# Patient Record
Sex: Female | Born: 1985
Health system: Southern US, Community
[De-identification: ages and names within clinical notes are randomized; demographics above are authoritative.]

## PROBLEM LIST (undated history)

## (undated) DIAGNOSIS — S82409A Unspecified fracture of shaft of unspecified fibula, initial encounter for closed fracture: Secondary | ICD-10-CM

## (undated) DIAGNOSIS — R112 Nausea with vomiting, unspecified: Secondary | ICD-10-CM

## (undated) DIAGNOSIS — Z9889 Other specified postprocedural states: Secondary | ICD-10-CM

## (undated) DIAGNOSIS — E119 Type 2 diabetes mellitus without complications: Secondary | ICD-10-CM

## (undated) DIAGNOSIS — S82209A Unspecified fracture of shaft of unspecified tibia, initial encounter for closed fracture: Secondary | ICD-10-CM

## (undated) DIAGNOSIS — R519 Headache, unspecified: Secondary | ICD-10-CM

## (undated) DIAGNOSIS — E669 Obesity, unspecified: Secondary | ICD-10-CM

## (undated) DIAGNOSIS — J4540 Moderate persistent asthma, uncomplicated: Secondary | ICD-10-CM

## (undated) HISTORY — DX: Other specified postprocedural states: R11.2

## (undated) HISTORY — DX: Unspecified fracture of shaft of unspecified tibia, initial encounter for closed fracture: S82.409A

## (undated) HISTORY — DX: Obesity, unspecified: E66.9

## (undated) HISTORY — DX: Moderate persistent asthma, uncomplicated: J45.40

## (undated) HISTORY — DX: Unspecified fracture of shaft of unspecified tibia, initial encounter for closed fracture: S82.209A

## (undated) HISTORY — PX: IM NAILING TIBIA: SUR734

## (undated) HISTORY — DX: Type 2 diabetes mellitus without complications: E11.9

---
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
## 2010-11-30 ENCOUNTER — Encounter: Payer: Self-pay | Admitting: *Deleted

## 2010-11-30 ENCOUNTER — Emergency Department (HOSPITAL_BASED_OUTPATIENT_CLINIC_OR_DEPARTMENT_OTHER)
Admission: EM | Admit: 2010-11-30 | Discharge: 2010-11-30 | Disposition: A | Discharge status: Home / Self Care | Payer: Self-pay | Attending: Emergency Medicine | Admitting: Emergency Medicine

## 2010-11-30 DIAGNOSIS — S39012A Strain of muscle, fascia and tendon of lower back, initial encounter: Secondary | ICD-10-CM

## 2010-11-30 DIAGNOSIS — N390 Urinary tract infection, site not specified: Secondary | ICD-10-CM | POA: Insufficient documentation

## 2010-11-30 DIAGNOSIS — S335XXA Sprain of ligaments of lumbar spine, initial encounter: Secondary | ICD-10-CM | POA: Insufficient documentation

## 2010-11-30 DIAGNOSIS — X500XXA Overexertion from strenuous movement or load, initial encounter: Secondary | ICD-10-CM | POA: Insufficient documentation

## 2010-11-30 LAB — URINALYSIS, ROUTINE W REFLEX MICROSCOPIC
Glucose, UA: NEGATIVE mg/dL
Hgb urine dipstick: NEGATIVE
Leukocytes, UA: NEGATIVE
pH: 5.5 (ref 5.0–8.0)

## 2010-11-30 LAB — PREGNANCY, URINE: Preg Test, Ur: NEGATIVE

## 2010-11-30 MED ORDER — DIAZEPAM 5 MG PO TABS: 5.0000 mg | ORAL_TABLET | Freq: Two times a day (BID) | ORAL | Status: AC

## 2010-11-30 MED ORDER — PHENAZOPYRIDINE HCL 200 MG PO TABS: 200.0000 mg | ORAL_TABLET | Freq: Three times a day (TID) | ORAL | Status: AC

## 2010-11-30 MED ORDER — IBUPROFEN 800 MG PO TABS: 800.0000 mg | ORAL_TABLET | Freq: Three times a day (TID) | ORAL | Status: AC

## 2010-11-30 NOTE — ED Provider Notes (Signed)
History     CSN: [id number] Arrival date & time: 11/30/2010  7:53 PM  Chief Complaint  Patient presents with  . Back Pain  . Urinary Tract Infection   HPI Pt has had sharp, intermittent pain in right low back for the past 3 days.  Non-radiating.  Aggravated by lifting.  Associated w/ dysuria, hematuria, decreased frequency of urination and incomplete evacuation.  Denies fever, LE weakness/paresthesias.  Denies trauma.  Had similar symptoms one month ago, was evaluated at another ER, had blood work and CT abd/pelvis and told that she may have passed a kidney stone.    History reviewed. No pertinent past medical history.  History reviewed. No pertinent past surgical history.  No family history on file.  History  Substance Use Topics  . Smoking status: Never Smoker   . Smokeless tobacco: Not on file  . Alcohol Use: No    OB History    Grav Para Term Preterm Abortions TAB SAB Ect Mult Living                  Review of Systems  All other systems reviewed and are negative.    Physical Exam  BP 137/93  Pulse 84  Temp(Src) 98 F (36.7 C) (Oral)  Resp 16  SpO2 100%  LMP 11/16/2010  Physical Exam  Nursing note and vitals reviewed. Constitutional: She is oriented to person, place, and time. She appears well-developed and well-nourished.  HENT:  Head: Normocephalic and atraumatic.  Eyes:       Normal appearance  Neck: Normal range of motion.  Cardiovascular: Normal rate and regular rhythm.   Pulmonary/Chest: Effort normal and breath sounds normal.  Abdominal: Soft. She exhibits no distension. There is no tenderness.  Musculoskeletal:       No spinal tenderness.  Lumbar paraspinal and right lower back ttp. No CVA ttp. Full ROM of LE.  Nml patellar reflexes.  No saddle anesthesia. Distal sensation intact.  2+ DP pulses.  Ambulates w/out diffulty.   Neurological: She is alert and oriented to person, place, and time.  Skin: Skin is warm and dry. No rash noted.    Psychiatric: She has a normal mood and affect. Her behavior is normal.    ED Course  Procedures  MDM Healthy 25yo F presents w/ non-traumatic low back pain.  Evaluated for same recently at another ED and told that she possibly passed a kidney stone.  Doubt kidney stone because no definite prior history, pain intermittent/brief, aggravated by movement and located inferior to right CVA and no hemoglobinuria.  U/A neg for infection.  No back pain red flags.  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  Recommendations for conservative therapy as well as strict return precautions discussed.  Discharged home with pain medication as well as pyridium for dysuira.       Otilio Miu, PA 12/01/10 0010

## 2010-11-30 NOTE — ED Notes (Signed)
Pt presents with low back pain and UTI sx.  Pt states hx of kidney stone about 2 mos ago.  Pt has had no sx since.  Pt took ibuprofen aroun 10p last night

## 2010-12-09 NOTE — ED Provider Notes (Signed)
Medical screening examination/treatment/procedure(s) were performed by non-physician practitioner and as supervising physician I was immediately available for consultation/collaboration.  Toy Baker, MD 12/09/10 [phone]

## 2012-04-19 ENCOUNTER — Emergency Department (HOSPITAL_BASED_OUTPATIENT_CLINIC_OR_DEPARTMENT_OTHER): Payer: Self-pay

## 2012-04-19 ENCOUNTER — Emergency Department (HOSPITAL_BASED_OUTPATIENT_CLINIC_OR_DEPARTMENT_OTHER)
Admission: EM | Admit: 2012-04-19 | Discharge: 2012-04-19 | Disposition: A | Discharge status: Home / Self Care | Payer: Self-pay | Attending: Emergency Medicine | Admitting: Emergency Medicine

## 2012-04-19 ENCOUNTER — Encounter (HOSPITAL_BASED_OUTPATIENT_CLINIC_OR_DEPARTMENT_OTHER): Payer: Self-pay

## 2012-04-19 DIAGNOSIS — N949 Unspecified condition associated with female genital organs and menstrual cycle: Secondary | ICD-10-CM | POA: Insufficient documentation

## 2012-04-19 DIAGNOSIS — N898 Other specified noninflammatory disorders of vagina: Secondary | ICD-10-CM | POA: Insufficient documentation

## 2012-04-19 DIAGNOSIS — R1031 Right lower quadrant pain: Secondary | ICD-10-CM | POA: Insufficient documentation

## 2012-04-19 DIAGNOSIS — R102 Pelvic and perineal pain: Secondary | ICD-10-CM

## 2012-04-19 DIAGNOSIS — R110 Nausea: Secondary | ICD-10-CM | POA: Insufficient documentation

## 2012-04-19 DIAGNOSIS — Z3202 Encounter for pregnancy test, result negative: Secondary | ICD-10-CM | POA: Insufficient documentation

## 2012-04-19 LAB — URINALYSIS, ROUTINE W REFLEX MICROSCOPIC
Leukocytes, UA: NEGATIVE
Nitrite: NEGATIVE
Specific Gravity, Urine: 1.018 (ref 1.005–1.030)
Urobilinogen, UA: 0.2 mg/dL (ref 0.0–1.0)
pH: 6 (ref 5.0–8.0)

## 2012-04-19 LAB — PREGNANCY, URINE: Preg Test, Ur: NEGATIVE

## 2012-04-19 LAB — URINE MICROSCOPIC-ADD ON

## 2012-04-19 LAB — WET PREP, GENITAL

## 2012-04-19 MED ORDER — MORPHINE SULFATE 4 MG/ML IJ SOLN
4.0000 mg | Freq: Once | INTRAMUSCULAR | Status: AC
  Administered 2012-04-19: 4 mg via INTRAVENOUS
  Filled 2012-04-19: qty 1

## 2012-04-19 MED ORDER — ONDANSETRON HCL 4 MG/2ML IJ SOLN
4.0000 mg | Freq: Once | INTRAMUSCULAR | Status: AC
  Administered 2012-04-19: 4 mg via INTRAVENOUS
  Filled 2012-04-19: qty 2

## 2012-04-19 MED ORDER — OXYCODONE-ACETAMINOPHEN 5-325 MG PO TABS: 1.0000 | ORAL_TABLET | ORAL | Status: DC | PRN

## 2012-04-19 NOTE — ED Notes (Signed)
C/o back pain and abd cramping started x 5 days ago

## 2012-04-19 NOTE — ED Notes (Signed)
Return from xray

## 2012-04-19 NOTE — ED Notes (Signed)
EDP Belfi notified pt reports blood when wipes after voiding-EDP ordered CCUA for UA

## 2012-04-19 NOTE — ED Notes (Signed)
MD at bedside. 

## 2012-04-19 NOTE — ED Provider Notes (Addendum)
History     CSN: [id number]  Arrival date & time 04/19/12  1202   First MD Initiated Contact with Patient 04/19/12 1216      Chief Complaint  Patient presents with  . Back Pain    (Consider location/radiation/quality/duration/timing/severity/associated sxs/prior treatment) HPI Comments: Patient presents with a four-day history of right flank pain. She states it started as cramping in her right lower abdomen and now it's progressed to her right mid back. It's not related to movement. Her pain now is mostly in her back she only has minimal pain in her right lower abdomen. She also has noted some vaginal bleeding and blood which she feels it's in her urine when she wipes. She denies any vaginal discharge. She's had some nausea but no vomiting. She denies any fevers or chills. She denies any known injury to her back. About one year ago she states she was diagnosed with a kidney stone but this was not actually seen on CT scan it was inferred given flank pain associated with hematuria. She's been taking ibuprofen without relief.   History reviewed. No pertinent past medical history.  Past Surgical History  Procedure Date  . Leg surgery     No family history on file.  History  Substance Use Topics  . Smoking status: Never Smoker   . Smokeless tobacco: Not on file  . Alcohol Use: No    OB History    Grav Para Term Preterm Abortions TAB SAB Ect Mult Living                  Review of Systems  Constitutional: Negative for fever, chills, diaphoresis and fatigue.  HENT: Negative for congestion, rhinorrhea and sneezing.   Eyes: Negative.   Respiratory: Negative for cough, chest tightness and shortness of breath.   Cardiovascular: Negative for chest pain and leg swelling.  Gastrointestinal: Positive for nausea and abdominal pain. Negative for vomiting, diarrhea and blood in stool.  Genitourinary: Positive for vaginal bleeding. Negative for frequency, hematuria, flank pain, vaginal  discharge and difficulty urinating.  Musculoskeletal: Positive for back pain. Negative for arthralgias.  Skin: Negative for rash.  Neurological: Negative for dizziness, speech difficulty, weakness, numbness and headaches.    Allergies  Ibuprofen  Home Medications   Current Outpatient Rx  Name  Route  Sig  Dispense  Refill  . IBUPROFEN 200 MG PO TABS   Oral   Take 200 mg by mouth 2 (two) times daily as needed. For pain             BP 121/62  Pulse 82  Temp 97.8 F (36.6 C) (Oral)  Resp 20  Ht 5\' 3"  (1.6 m)  Wt 225 lb (102.059 kg)  BMI 39.86 kg/m2  SpO2 100%  LMP 04/15/2012  Physical Exam  Constitutional: She is oriented to person, place, and time. She appears well-developed and well-nourished.  HENT:  Head: Normocephalic and atraumatic.  Eyes: Pupils are equal, round, and reactive to light.  Neck: Normal range of motion. Neck supple.  Cardiovascular: Normal rate, regular rhythm and normal heart sounds.   Pulmonary/Chest: Effort normal and breath sounds normal. No respiratory distress. She has no wheezes. She has no rales. She exhibits no tenderness.  Abdominal: Soft. Bowel sounds are normal. There is no tenderness (mild TTP right lower abd). There is no rebound and no guarding.       +right CVA tenderness  Genitourinary:       Hymen appears intact.  +tenderness over uterine  and adnexal area.  Some dark blood in vault.  No discharge.  Musculoskeletal: Normal range of motion. She exhibits no edema.  Lymphadenopathy:    She has no cervical adenopathy.  Neurological: She is alert and oriented to person, place, and time.  Skin: Skin is warm and dry. No rash noted.  Psychiatric: She has a normal mood and affect.    ED Course  Procedures (including critical care time)  Results for orders placed during the hospital encounter of 04/19/12  URINALYSIS, ROUTINE W REFLEX MICROSCOPIC      Component Value Range   Color, Urine YELLOW  YELLOW   APPearance CLOUDY (*) CLEAR    Specific Gravity, Urine 1.018  1.005 - 1.030   pH 6.0  5.0 - 8.0   Glucose, UA NEGATIVE  NEGATIVE mg/dL   Hgb urine dipstick LARGE (*) NEGATIVE   Bilirubin Urine NEGATIVE  NEGATIVE   Ketones, ur NEGATIVE  NEGATIVE mg/dL   Protein, ur NEGATIVE  NEGATIVE mg/dL   Urobilinogen, UA 0.2  0.0 - 1.0 mg/dL   Nitrite NEGATIVE  NEGATIVE   Leukocytes, UA NEGATIVE  NEGATIVE  PREGNANCY, URINE      Component Value Range   Preg Test, Ur NEGATIVE  NEGATIVE  URINE MICROSCOPIC-ADD ON      Component Value Range   Squamous Epithelial / LPF FEW (*) RARE   WBC, UA 0-2  <3 WBC/hpf   RBC / HPF 3-6  <3 RBC/hpf   Bacteria, UA MANY (*) RARE  WET PREP, GENITAL      Component Value Range   Yeast Wet Prep HPF POC NONE SEEN  NONE SEEN   Trich, Wet Prep NONE SEEN  NONE SEEN   Clue Cells Wet Prep HPF POC FEW (*) NONE SEEN   WBC, Wet Prep HPF POC FEW (*) NONE SEEN   Ct Abdomen Pelvis Wo Contrast  04/19/2012  *RADIOLOGY REPORT*  Clinical Data: 5-day history of right flank pain.  Prior history of urinary tract calculi.  CT ABDOMEN AND PELVIS WITHOUT CONTRAST  Technique:  Multidetector CT imaging of the abdomen and pelvis was performed following the standard protocol without intravenous contrast.  Comparison: None.  Findings: No evidence of urinary tract calculi or obstruction on either side.  Within the limits of the unenhanced technique, no focal parenchymal abnormality involving either kidney.  Sub-centimeter cyst in the anterior segment right lobe of liver near the dome; within the limits of the unenhanced technique, no significant focal hepatic parenchymal abnormalities.  Normal unenhanced appearance of the spleen, pancreas, adrenal glands, and gallbladder.  No biliary ductal dilation.  No visible aorto- iliofemoral atherosclerosis.  No significant lymphadenopathy.  Normal-appearing stomach and small bowel.  Large stool burden throughout normal appearing colon.  Small appendicolith in the distal appendix which is  positioned in the mid and upper pelvis; no evidence of acute appendicitis.  No ascites.  Urinary bladder unremarkable.  Normal-appearing uterus and ovaries for age.  No free pelvic fluid.  Phlebolith low in the right side of the pelvis.  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  Visualized lung bases clear.  Heart size normal.  IMPRESSION:  1.  No evidence of urinary tract calculi or obstruction. 2.  No acute abnormalities involving the abdomen or pelvis.  Large stool burden. 3.  Appendicolith in the distal appendix without evidence of acute appendicitis.   Original Report Authenticated By: Hulan Saas, M.D.  1. Pelvic pain       MDM  PT with right pelvic pain/right lower back pain.  No vaginal discharge suggestive of infection.  No evidence of kidney stone.  Hematuria is likely from contamination from vaginal bleeding.  Pelvic u/s pending.  Dr Preston Fleeting to follow up on results.  Anticipate if negative, pt to be discharge home with analgesics.        Rolan Bucco, MD 04/19/12 1507  Ultrasound report has come back and shows no evidence of ovarian cyst or torsion. She will be treated symptomatically and is sent home with prescription for Percocet.   US Pelvis Complete  04/19/2012  *RADIOLOGY REPORT*  Clinical Data:  Right flank pain and right-sided pelvic pain that began on 04/15/2012, the date of her LMP.  TRANSABDOMINAL AND TRANSVAGINAL ULTRASOUND OF PELVIS DOPPLER ULTRASOUND OF OVARIES  Technique:  Both transabdominal and transvaginal ultrasound examinations of the pelvis were performed. Transabdominal technique was performed for global imaging of the pelvis including uterus, ovaries, adnexal regions, and pelvic cul-de-sac.  It was necessary to proceed with endovaginal exam following the transabdominal exam to visualize the endometrium and ovaries, as the bladder was  incompletely distended.  Color and duplex Doppler ultrasound was utilized to evaluate blood flow to the ovaries.  Comparison:  Unenhanced CT abdomen pelvis performed earlier same date.  No prior ultrasound.  Findings:  Uterus:  Normal in size and appearance without focal myometrial abnormality, measuring approximate 6.2 x 3.4 x 4.7 cm.  Endometrium:  Normal in appearance measuring 1-3 mm in thickness. No endometrial fluid or mass.  Right ovary: Normal in size and appearance, containing multiple small follicular cysts, measuring approximately 4.0 x 2.8 x 2.1 cm. Normal color Doppler flow within the ovary.  No dominant cyst or solid mass.  Left ovary:   Normal in size and appearance, containing multiple small follicular cysts, measuring approximately 3.9 x 2.0 x 3.0 cm. Normal color Doppler flow within the ovary.  No dominant cyst or solid mass.  Pulsed Doppler evaluation demonstrates normal low-resistance arterial and venous waveforms in both ovaries.  Other findings:  Minimal, likely physiologic free fluid in the cul- de-sac.  IMPRESSION: Normal exam.  No evidence of pelvic mass or other significant abnormality.  No sonographic evidence for ovarian torsion.   Original Report Authenticated By: Hulan Saas, M.D.       Dione Booze, MD 04/19/12 [id number]

## 2012-04-19 NOTE — ED Notes (Signed)
MD at bedside. EDP Preston Fleeting at [facility]

## 2012-04-19 NOTE — ED Notes (Signed)
Spoke to pt in depth about need for GYN f/u due to her c/o of menstrual pds x2 less than 28 days as she was also advised by EDP Belfi-pt was advised to f/u with her local [facility] (she lives in [place]) also advised she could be seen at [facility] or [facility]

## 2012-04-19 NOTE — ED Notes (Signed)
Pt to xray for US 

## 2012-04-21 LAB — GC/CHLAMYDIA PROBE AMP
CT Probe RNA: NEGATIVE
GC Probe RNA: NEGATIVE

## 2012-06-26 DIAGNOSIS — E559 Vitamin D deficiency, unspecified: Secondary | ICD-10-CM | POA: Insufficient documentation

## 2012-09-09 DIAGNOSIS — J4550 Severe persistent asthma, uncomplicated: Secondary | ICD-10-CM | POA: Insufficient documentation

## 2014-10-28 ENCOUNTER — Encounter: Payer: Self-pay | Admitting: Sports Medicine

## 2014-10-28 ENCOUNTER — Ambulatory Visit (INDEPENDENT_AMBULATORY_CARE_PROVIDER_SITE_OTHER): Payer: BC Managed Care – PPO | Admitting: Sports Medicine

## 2014-10-28 VITALS — BP 149/102 | HR 96 | Ht 63.0 in | Wt 275.0 lb

## 2014-10-28 DIAGNOSIS — E669 Obesity, unspecified: Secondary | ICD-10-CM

## 2014-10-28 DIAGNOSIS — M722 Plantar fascial fibromatosis: Secondary | ICD-10-CM | POA: Insufficient documentation

## 2014-10-28 DIAGNOSIS — J4540 Moderate persistent asthma, uncomplicated: Secondary | ICD-10-CM | POA: Diagnosis not present

## 2014-10-28 DIAGNOSIS — Z0000 Encounter for general adult medical examination without abnormal findings: Secondary | ICD-10-CM | POA: Insufficient documentation

## 2014-10-28 HISTORY — DX: Obesity, unspecified: E66.9

## 2014-10-28 HISTORY — DX: Moderate persistent asthma, uncomplicated: J45.40

## 2014-10-28 LAB — CBC
HCT: 45.5 % (ref 36.0–46.0)
Hemoglobin: 15.4 g/dL — ABNORMAL HIGH (ref 12.0–15.0)
MCH: 27.5 pg (ref 26.0–34.0)
MCHC: 33.8 g/dL (ref 30.0–36.0)
MCV: 81.4 fL (ref 78.0–100.0)
MPV: 11 fL (ref 8.6–12.4)
Platelets: 249 K/uL (ref 150–400)
RBC: 5.59 MIL/uL — ABNORMAL HIGH (ref 3.87–5.11)
RDW: 13.5 % (ref 11.5–15.5)
WBC: 4.9 K/uL (ref 4.0–10.5)

## 2014-10-28 LAB — TSH: TSH: 2.557 u[IU]/mL (ref 0.350–4.500)

## 2014-10-28 LAB — COMPREHENSIVE METABOLIC PANEL
Albumin: 4.3 g/dL (ref 3.5–5.2)
BUN: 14 mg/dL (ref 6–23)
CO2: 24 mEq/L (ref 19–32)
Chloride: 107 mEq/L (ref 96–112)
Glucose, Bld: 92 mg/dL (ref 70–99)
Total Bilirubin: 0.6 mg/dL (ref 0.2–1.2)

## 2014-10-28 LAB — COMPREHENSIVE METABOLIC PANEL WITH GFR
ALT: 30 U/L (ref 0–35)
AST: 23 U/L (ref 0–37)
Alkaline Phosphatase: 69 U/L (ref 39–117)
Calcium: 9.5 mg/dL (ref 8.4–10.5)
Creat: 0.73 mg/dL (ref 0.50–1.10)
Potassium: 4.1 meq/L (ref 3.5–5.3)
Sodium: 142 meq/L (ref 135–145)
Total Protein: 7.2 g/dL (ref 6.0–8.3)

## 2014-10-28 LAB — LIPID PANEL
Cholesterol: 178 mg/dL (ref 0–200)
HDL: 45 mg/dL — ABNORMAL LOW (ref >=46)
LDL Cholesterol: 111 mg/dL — ABNORMAL HIGH (ref 0–99)
Total CHOL/HDL Ratio: 4 Ratio
Triglycerides: 108 mg/dL (ref <150)
VLDL: 22 mg/dL (ref 0–40)

## 2014-10-28 LAB — HEMOGLOBIN A1C
Hgb A1c MFr Bld: 5.9 % — ABNORMAL HIGH (ref <5.7)
Mean Plasma Glucose: 123 mg/dL — ABNORMAL HIGH (ref <117)

## 2014-10-28 MED ORDER — PHENTERMINE HCL 37.5 MG PO TABS: ORAL_TABLET | ORAL | Status: DC

## 2014-10-28 MED ORDER — LIRAGLUTIDE -WEIGHT MANAGEMENT 18 MG/3ML ~~LOC~~ SOPN: 3.0000 mg | PEN_INJECTOR | Freq: Every day | SUBCUTANEOUS | Status: DC

## 2014-10-28 MED ORDER — BUDESONIDE-FORMOTEROL FUMARATE 160-4.5 MCG/ACT IN AERO: 1.0000 | INHALATION_SPRAY | Freq: Two times a day (BID) | RESPIRATORY_TRACT | Status: DC

## 2014-10-28 NOTE — Progress Notes (Signed)
  Subjective:    CC: Establish care.   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  Obesity: Desires help with weight loss, has tried dieting and exercise without much improvement.  Heel pain: Left-sided, worse in the morning, localized on the plantar aspect of the calcaneal insertion of the plantar fascia, moderate, persistent without radiation, present for several months.  Moderate persistent asthma: Currently using Symbicort, Nasonex, Singulair, overall she does well but her Symbicort is very expensive.  Preventive measures: Up-to-date on cervical cancer screening.  Past medical history, Surgical history, Family history not pertinant except as noted below, Social history, Allergies, and medications have been entered into the medical record, reviewed, and no changes needed.   Review of Systems: No headache, visual changes, nausea, vomiting, diarrhea, constipation, dizziness, abdominal pain, skin rash, fevers, chills, night sweats, swollen lymph nodes, weight loss, chest pain, body aches, joint swelling, muscle aches, shortness of breath, mood changes, visual or auditory hallucinations.  Objective:    General: Well Developed, well nourished, and in no acute distress.  Neuro: Alert and oriented x3, extra-ocular muscles intact, sensation grossly intact.  HEENT: Normocephalic, atraumatic, pupils equal round reactive to light, neck supple, no masses, no lymphadenopathy, thyroid nonpalpable.  Skin: Warm and dry, no rashes noted.  Cardiac: Regular rate and rhythm, no murmurs rubs or gallops.  Respiratory: Clear to auscultation bilaterally. Not using accessory muscles, speaking in full sentences.  Abdominal: Soft, nontender, nondistended, positive bowel sounds, no masses, no organomegaly.  Left Foot: No visible erythema or swelling. Range of motion is full in all directions. Strength is 5/5 in all directions. No hallux valgus. No pes cavus or pes  planus. No abnormal callus noted. No pain over the navicular prominence, or base of fifth metatarsal. Tender to palpation of the calcaneal insertion of plantar fascia. No pain at the Achilles insertion. No pain over the calcaneal bursa. No pain of the retrocalcaneal bursa. No tenderness to palpation over the tarsals, metatarsals, or phalanges. No hallux rigidus or limitus. No tenderness palpation over interphalangeal joints. No pain with compression of the metatarsal heads. Neurovascularly intact distally.  Impression and Recommendations:    The patient was counselled, risk factors were discussed, anticipatory guidance given.

## 2014-10-28 NOTE — Assessment & Plan Note (Signed)
Up to date on cervical cancer screening

## 2014-10-28 NOTE — Assessment & Plan Note (Signed)
Phentermine, Saxenda, checking blood work. Return for nurse visit to learn how to do injections and then return in one month for a weight check

## 2014-10-28 NOTE — Assessment & Plan Note (Signed)
Under good control, refilling Symbicort with a discount coupon

## 2014-10-28 NOTE — Assessment & Plan Note (Signed)
Rehabilitation exercises, return for custom orthotics.

## 2014-10-29 ENCOUNTER — Telehealth: Payer: Self-pay | Admitting: Sports Medicine

## 2014-10-29 LAB — VITAMIN D 25 HYDROXY (VIT D DEFICIENCY, FRACTURES): Vit D, 25-Hydroxy: 14 ng/mL — ABNORMAL LOW (ref 30–100)

## 2014-10-29 MED ORDER — VITAMIN D (ERGOCALCIFEROL) 1.25 MG (50000 UNIT) PO CAPS
50000.0000 [IU] | ORAL_CAPSULE | ORAL | Status: DC
Start: 2014-10-29 — End: 2015-04-14

## 2014-10-29 NOTE — Telephone Encounter (Signed)
Received fax from pharmacy for phentermine 37.5 mg tablets sent through cover my meds and it is authorized from 09/29/2014 - 01/27/2015 case id [id number]. - CF

## 2014-10-29 NOTE — Addendum Note (Signed)
Addended by: Monica BectonHEKKEKANDAM, THOMAS J on: 10/29/2014 12:20 PM   Modules accepted: Orders

## 2014-11-01 ENCOUNTER — Telehealth: Payer: Self-pay

## 2014-11-01 DIAGNOSIS — E669 Obesity, unspecified: Secondary | ICD-10-CM

## 2014-11-01 MED ORDER — LIRAGLUTIDE 18 MG/3ML ~~LOC~~ SOPN: PEN_INJECTOR | SUBCUTANEOUS | Status: DC

## 2014-11-01 NOTE — Telephone Encounter (Signed)
Patient called stated that she can not afford the Saxenda even with the discount coupon it is $1000. Please advise patient what the next option is. Rhonda Cunningham,CMA

## 2014-11-01 NOTE — Telephone Encounter (Signed)
Switch to victoza, Rx here, print out coupon online.

## 2014-11-02 NOTE — Telephone Encounter (Signed)
Spoke to patient advised her that Rx Victoza could be picked up from the office and to also print discount coupon online. Azrael Huss,CMA

## 2014-11-03 ENCOUNTER — Ambulatory Visit (INDEPENDENT_AMBULATORY_CARE_PROVIDER_SITE_OTHER): Payer: BC Managed Care – PPO | Admitting: Sports Medicine

## 2014-11-03 VITALS — BP 124/84 | HR 102 | Wt 270.0 lb

## 2014-11-03 DIAGNOSIS — E669 Obesity, unspecified: Secondary | ICD-10-CM

## 2014-11-03 NOTE — Progress Notes (Signed)
Patient came into clinic today for education on administration of Victoza. Pt brought the injection into office today with her, and had her own needles. Went into great detail on administration locations, how to apply/remove needles, how to dial up dose (0.6mg, increased weekly by 0.6mg until max dose of 1.8mg reached), administration of injection, and proper disposal of used needles. Stressed the importance of needle safety and went over risk of infection possibilities or contamination that could occur if needles aren't changed after every injection. Pt states she would change the needle each time. Pt was able to administer the injection in office today, no complications. No further questions. Advised Pt to contact office if she has any further questions or concerns, verbalized understanding.  

## 2014-11-03 NOTE — Assessment & Plan Note (Signed)
Injection education as above.

## 2014-11-25 ENCOUNTER — Ambulatory Visit (INDEPENDENT_AMBULATORY_CARE_PROVIDER_SITE_OTHER): Payer: BC Managed Care – PPO | Admitting: Sports Medicine

## 2014-11-25 ENCOUNTER — Encounter: Payer: Self-pay | Admitting: Sports Medicine

## 2014-11-25 VITALS — BP 129/82 | HR 98 | Ht 63.0 in | Wt 259.0 lb

## 2014-11-25 DIAGNOSIS — M722 Plantar fascial fibromatosis: Secondary | ICD-10-CM | POA: Diagnosis not present

## 2014-11-25 DIAGNOSIS — E669 Obesity, unspecified: Secondary | ICD-10-CM | POA: Diagnosis not present

## 2014-11-25 MED ORDER — LIRAGLUTIDE 18 MG/3ML ~~LOC~~ SOPN: PEN_INJECTOR | SUBCUTANEOUS | Status: DC

## 2014-11-25 MED ORDER — PHENTERMINE HCL 37.5 MG PO TABS: ORAL_TABLET | ORAL | Status: DC

## 2014-11-25 NOTE — Assessment & Plan Note (Signed)
16 pound weight loss in the first month, refilling phentermine, she will increase Victoza dose consecutively up to 3 mg. Return in one month for a weight check and refills.

## 2014-11-25 NOTE — Assessment & Plan Note (Signed)
Custom orthotics as above.  Continue rehabilitation exercises and return in one month.

## 2014-11-25 NOTE — Progress Notes (Signed)

## 2014-12-23 ENCOUNTER — Ambulatory Visit (INDEPENDENT_AMBULATORY_CARE_PROVIDER_SITE_OTHER): Payer: BC Managed Care – PPO | Admitting: Sports Medicine

## 2014-12-23 ENCOUNTER — Encounter: Payer: Self-pay | Admitting: Sports Medicine

## 2014-12-23 VITALS — BP 142/89 | HR 107 | Ht 63.0 in | Wt 249.0 lb

## 2014-12-23 DIAGNOSIS — E669 Obesity, unspecified: Secondary | ICD-10-CM | POA: Diagnosis not present

## 2014-12-23 MED ORDER — PHENTERMINE HCL 37.5 MG PO TABS: 37.5000 mg | ORAL_TABLET | Freq: Every day | ORAL | Status: DC

## 2014-12-23 NOTE — Assessment & Plan Note (Signed)
Fantastic, 10 additional pound weight loss after the second month of phentermine. 24 pounds total so far Continue the toes and phentermine. Return in one month

## 2014-12-23 NOTE — Progress Notes (Signed)
  Subjective:    CC:  Follow-up  HPI: Obesity: Fantastic weight loss after the  Second month, 24 pounds total, 10 pounds in the past month, no side effects, happy with how things are going so far.  Past medical history, Surgical history, Family history not pertinant except as noted below, Social history, Allergies, and medications have been entered into the medical record, reviewed, and no changes needed.   Review of Systems: No fevers, chills, night sweats, weight loss, chest pain, or shortness of breath.   Objective:    General: Well Developed, well nourished, and in no acute distress.  Neuro: Alert and oriented x3, extra-ocular muscles intact, sensation grossly intact.  HEENT: Normocephalic, atraumatic, pupils equal round reactive to light, neck supple, no masses, no lymphadenopathy, thyroid nonpalpable.  Skin: Warm and dry, no rashes. Cardiac: Regular rate and rhythm, no murmurs rubs or gallops, no lower extremity edema.  Respiratory: Clear to auscultation bilaterally. Not using accessory muscles, speaking in full sentences.  Impression and Recommendations:

## 2015-01-20 ENCOUNTER — Ambulatory Visit (INDEPENDENT_AMBULATORY_CARE_PROVIDER_SITE_OTHER): Payer: BC Managed Care – PPO | Admitting: Sports Medicine

## 2015-01-20 ENCOUNTER — Encounter: Payer: Self-pay | Admitting: Sports Medicine

## 2015-01-20 VITALS — BP 142/94 | HR 98 | Ht 63.0 in | Wt 240.0 lb

## 2015-01-20 DIAGNOSIS — E669 Obesity, unspecified: Secondary | ICD-10-CM

## 2015-01-20 MED ORDER — LIRAGLUTIDE 18 MG/3ML ~~LOC~~ SOPN
1.8000 mg | PEN_INJECTOR | Freq: Every day | SUBCUTANEOUS | Status: DC
Start: 2015-01-20 — End: 2015-04-14

## 2015-01-20 MED ORDER — PHENTERMINE HCL 37.5 MG PO TABS: 37.5000 mg | ORAL_TABLET | Freq: Every day | ORAL | Status: DC

## 2015-01-20 NOTE — Progress Notes (Signed)
  Subjective:    CC: Weight check  HPI: Virginia May returns, she continues to have excellent weight loss, 10 pounds since the last month and 35 pounds total.  Past medical history, Surgical history, Family history not pertinant except as noted below, Social history, Allergies, and medications have been entered into the medical record, reviewed, and no changes needed.   Review of Systems: No fevers, chills, night sweats, weight loss, chest pain, or shortness of breath.   Objective:    General: Well Developed, well nourished, and in no acute distress.  Neuro: Alert and oriented x3, extra-ocular muscles intact, sensation grossly intact.  HEENT: Normocephalic, atraumatic, pupils equal round reactive to light, neck supple, no masses, no lymphadenopathy, thyroid nonpalpable.  Skin: Warm and dry, no rashes. Cardiac: Regular rate and rhythm, no murmurs rubs or gallops, no lower extremity edema.  Respiratory: Clear to auscultation bilaterally. Not using accessory muscles, speaking in full sentences.  Impression and Recommendations:

## 2015-01-20 NOTE — Assessment & Plan Note (Signed)
10 pounds since last month and 35 total pounds lost as we entered the fourth month of phentermine. She will continue Victoza, at the next visit if she plateaus we will add Topamax, the mechanism was discussed with her today. Return in one month.

## 2015-02-17 ENCOUNTER — Encounter: Payer: Self-pay | Admitting: Sports Medicine

## 2015-02-17 ENCOUNTER — Ambulatory Visit (INDEPENDENT_AMBULATORY_CARE_PROVIDER_SITE_OTHER): Payer: BC Managed Care – PPO | Admitting: Sports Medicine

## 2015-02-17 VITALS — BP 128/73 | HR 98 | Ht 63.0 in | Wt 231.0 lb

## 2015-02-17 DIAGNOSIS — E669 Obesity, unspecified: Secondary | ICD-10-CM

## 2015-02-17 DIAGNOSIS — R635 Abnormal weight gain: Secondary | ICD-10-CM | POA: Diagnosis not present

## 2015-02-17 DIAGNOSIS — J4540 Moderate persistent asthma, uncomplicated: Secondary | ICD-10-CM

## 2015-02-17 MED ORDER — AZITHROMYCIN 250 MG PO TABS: ORAL_TABLET | ORAL | Status: DC

## 2015-02-17 MED ORDER — PHENTERMINE HCL 37.5 MG PO TABS: 37.5000 mg | ORAL_TABLET | Freq: Every day | ORAL | Status: DC

## 2015-02-17 NOTE — Assessment & Plan Note (Signed)
Currently in mild exacerbation, continue inhalers, adding azithromycin without steroids.

## 2015-02-17 NOTE — Progress Notes (Signed)
  Subjective:    CC: weight check  HPI: After 4 months of phentermine serum has lost an additional 9 pounds bringing the total to 44 pounds. Doing well with Victoza as well.  Cough: She does have a history of asthma, using her inhalers as prescribed, she is having increasing cough and wheeze. No shortness of breath.  Past medical history, Surgical history, Family history not pertinant except as noted below, Social history, Allergies, and medications have been entered into the medical record, reviewed, and no changes needed.   Review of Systems: No fevers, chills, night sweats, weight loss, chest pain, or shortness of breath.   Objective:    General: Well Developed, well nourished, and in no acute distress.  Neuro: Alert and oriented x3, extra-ocular muscles intact, sensation grossly intact.  HEENT: Normocephalic, atraumatic, pupils equal round reactive to light, neck supple, no masses, no lymphadenopathy, thyroid nonpalpable.  Skin: Warm and dry, no rashes. Cardiac: Regular rate and rhythm, no murmurs rubs or gallops, no lower extremity edema.  Respiratory: there are sparse inspiratory and expiratory wheezes. Not using accessory muscles, speaking in full sentences.  Impression and Recommendations:

## 2015-02-17 NOTE — Assessment & Plan Note (Signed)
Additional mine pound weight loss bringing the total weight loss to 44 pounds after 4 months. Refilling phentermine, continue Victoza as we entered the fifth month, we will consider adding Topamax if plateaus at any point.

## 2015-03-17 ENCOUNTER — Encounter: Payer: Self-pay | Admitting: Sports Medicine

## 2015-03-17 ENCOUNTER — Ambulatory Visit (INDEPENDENT_AMBULATORY_CARE_PROVIDER_SITE_OTHER): Payer: BC Managed Care – PPO | Admitting: Sports Medicine

## 2015-03-17 VITALS — BP 123/83 | HR 110 | Wt 226.0 lb

## 2015-03-17 DIAGNOSIS — M722 Plantar fascial fibromatosis: Secondary | ICD-10-CM | POA: Diagnosis not present

## 2015-03-17 DIAGNOSIS — E669 Obesity, unspecified: Secondary | ICD-10-CM

## 2015-03-17 MED ORDER — PHENTERMINE HCL 37.5 MG PO TABS: 37.5000 mg | ORAL_TABLET | Freq: Every day | ORAL | Status: DC

## 2015-03-17 MED ORDER — MELOXICAM 15 MG PO TABS: ORAL_TABLET | ORAL | Status: DC

## 2015-03-17 NOTE — Progress Notes (Signed)
  Subjective:    CC: Weight check  HPI: Virginia May is a pleasant 29 year old female, she has now been on phentermine for 5 full months and has lost an additional 5-6 pounds bring her total weight loss to 50 pounds. She continues with Victoza, we discussed adding Topamax if no improvement at a previous visit.  Left plantar fasciitis: Initially did well with custom orthotics and rehabilitation exercises, now having a recurrence of pain but not taking any oral NSAIDs. Agreeable to continue to proceed conservatively before considering interventional injection.  Past medical history, Surgical history, Family history not pertinant except as noted below, Social history, Allergies, and medications have been entered into the medical record, reviewed, and no changes needed.   Review of Systems: No fevers, chills, night sweats, weight loss, chest pain, or shortness of breath.   Objective:    General: Well Developed, well nourished, and in no acute distress.  Neuro: Alert and oriented x3, extra-ocular muscles intact, sensation grossly intact.  HEENT: Normocephalic, atraumatic, pupils equal round reactive to light, neck supple, no masses, no lymphadenopathy, thyroid nonpalpable.  Skin: Warm and dry, no rashes. Cardiac: Regular rate and rhythm, no murmurs rubs or gallops, no lower extremity edema.  Respiratory: Clear to auscultation bilaterally. Not using accessory muscles, speaking in full sentences.  Impression and Recommendations:    I spent 25 minutes with this patient, greater than 50% was face-to-face time counseling regarding the above diagnoses

## 2015-03-17 NOTE — Assessment & Plan Note (Signed)
Initially did well with custom orthotics and rehabilitation exercises, now having recurrence of pain. Adding meloxicam, return to see me in one month to consider injection if no better.

## 2015-03-17 NOTE — Assessment & Plan Note (Signed)
Additional 5-6 pound weight loss after 5 full months of phentermine, this brings the weight loss total to 50 pounds over 5 months. Continue Victoza, we did discuss adding Topamax if no improvement at the next visit.

## 2015-04-14 ENCOUNTER — Ambulatory Visit (INDEPENDENT_AMBULATORY_CARE_PROVIDER_SITE_OTHER): Payer: BC Managed Care – PPO | Admitting: Sports Medicine

## 2015-04-14 VITALS — BP 138/93 | HR 112 | Wt 222.0 lb

## 2015-04-14 DIAGNOSIS — M722 Plantar fascial fibromatosis: Secondary | ICD-10-CM | POA: Diagnosis not present

## 2015-04-14 DIAGNOSIS — E669 Obesity, unspecified: Secondary | ICD-10-CM

## 2015-04-14 MED ORDER — PHENTERMINE HCL 37.5 MG PO TABS: 18.7500 mg | ORAL_TABLET | Freq: Every day | ORAL | Status: DC

## 2015-04-14 MED ORDER — LIRAGLUTIDE 18 MG/3ML ~~LOC~~ SOPN: 1.8000 mg | PEN_INJECTOR | Freq: Every day | SUBCUTANEOUS | Status: DC

## 2015-04-14 MED ORDER — TOPIRAMATE 50 MG PO TABS: ORAL_TABLET | ORAL | Status: DC

## 2015-04-14 NOTE — Assessment & Plan Note (Signed)
Additional 4 pound weight loss after 6 months of phentermine, this brings the total weight loss to 54 pounds , switching to one half tablet phentermine daily, continue Victoza and adding Topamax, return in 3 months.

## 2015-04-14 NOTE — Progress Notes (Signed)
  Subjective:    CC: weight check  HPI: This is a pleasant 29 year old female, she has now finished 6 months of phentermine and has lost 54 pounds, she continues to do Victoza as well but has not yet started Topamax.  Plantar fasciitis: Does okay, most days 0 pain, does not hurt enough to consider an injection.  Past medical history, Surgical history, Family history not pertinant except as noted below, Social history, Allergies, and medications have been entered into the medical record, reviewed, and no changes needed.   Review of Systems: No fevers, chills, night sweats, weight loss, chest pain, or shortness of breath.   Objective:    General: Well Developed, well nourished, and in no acute distress.  Neuro: Alert and oriented x3, extra-ocular muscles intact, sensation grossly intact.  HEENT: Normocephalic, atraumatic, pupils equal round reactive to light, neck supple, no masses, no lymphadenopathy, thyroid nonpalpable.  Skin: Warm and dry, no rashes. Cardiac: Regular rate and rhythm, no murmurs rubs or gallops, no lower extremity edema.  Respiratory: Clear to auscultation bilaterally. Not using accessory muscles, speaking in full sentences.  Impression and Recommendations:

## 2015-04-14 NOTE — Assessment & Plan Note (Signed)
Overall doing well, no need for injection.

## 2015-04-25 ENCOUNTER — Telehealth: Payer: Self-pay

## 2015-04-25 NOTE — Telephone Encounter (Signed)
Pt.notified

## 2015-04-25 NOTE — Telephone Encounter (Signed)
Pt would like to know if primary dx code can be changed from obesity for her 02/17/15 visit. Insurance will not cover this visit.

## 2015-04-25 NOTE — Telephone Encounter (Signed)
I have changed the documentation, I will forward this to Toniann FailWendy to resubmit the 02/17/2015 visit with a new diagnosis.

## 2015-05-25 ENCOUNTER — Encounter: Payer: Self-pay | Admitting: Sports Medicine

## 2015-05-25 ENCOUNTER — Ambulatory Visit (INDEPENDENT_AMBULATORY_CARE_PROVIDER_SITE_OTHER): Payer: BC Managed Care – PPO | Admitting: Sports Medicine

## 2015-05-25 DIAGNOSIS — J4540 Moderate persistent asthma, uncomplicated: Secondary | ICD-10-CM

## 2015-05-25 MED ORDER — BENZONATATE 200 MG PO CAPS: 200.0000 mg | ORAL_CAPSULE | Freq: Three times a day (TID) | ORAL | Status: DC | PRN

## 2015-05-25 MED ORDER — HYDROCOD POLST-CPM POLST ER 10-8 MG/5ML PO SUER: 5.0000 mL | Freq: Two times a day (BID) | ORAL | Status: DC | PRN

## 2015-05-25 MED ORDER — ALBUTEROL SULFATE HFA 108 (90 BASE) MCG/ACT IN AERS: 2.0000 | INHALATION_SPRAY | Freq: Four times a day (QID) | RESPIRATORY_TRACT | Status: DC | PRN

## 2015-05-25 MED ORDER — AZITHROMYCIN 250 MG PO TABS: ORAL_TABLET | ORAL | Status: DC

## 2015-05-25 MED ORDER — PREDNISONE 50 MG PO TABS: 50.0000 mg | ORAL_TABLET | Freq: Every day | ORAL | Status: DC

## 2015-05-25 NOTE — Assessment & Plan Note (Signed)
Currently in mild exacerbation, azithromycin, prednisone, Tussionex, Tessalon Perles, refilling albuterol. Chest x-ray.

## 2015-05-25 NOTE — Progress Notes (Signed)
  Subjective:    CC: cough  HPI: This is a pleasant 30 year old female, for the past 2 days she's had increasing cough, minimal tightness in her chest, runny nose, and overall malaise, mild body aches. Only minimal shortness of breath, she has been using her rescue inhaler more often he needs a refill. Symptoms are moderate, persistent.  Past medical history, Surgical history, Family history not pertinant except as noted below, Social history, Allergies, and medications have been entered into the medical record, reviewed, and no changes needed.   Review of Systems: No fevers, chills, night sweats, weight loss, chest pain, or shortness of breath.   Objective:    General: Well Developed, well nourished, and in no acute distress.  Neuro: Alert and oriented x3, extra-ocular muscles intact, sensation grossly intact.  HEENT: Normocephalic, atraumatic, pupils equal round reactive to light, neck supple, no masses, no lymphadenopathy, thyroid nonpalpable. oropharynx, nasopharynx, ear canals examined and the only abnormality is erythematous and boggy turbinate bones Skin: Warm and dry, no rashes. Cardiac: Regular rate and rhythm, no murmurs rubs or gallops, no lower extremity edema.  Respiratory: Clear to auscultation bilaterally. Not using accessory muscles, speaking in full sentences.  Coughing in exam room  Impression and Recommendations:

## 2015-07-12 ENCOUNTER — Ambulatory Visit (INDEPENDENT_AMBULATORY_CARE_PROVIDER_SITE_OTHER): Payer: BC Managed Care – PPO | Admitting: Sports Medicine

## 2015-07-12 VITALS — BP 116/78 | HR 82 | Resp 18

## 2015-07-12 DIAGNOSIS — R635 Abnormal weight gain: Secondary | ICD-10-CM

## 2015-07-12 DIAGNOSIS — E669 Obesity, unspecified: Secondary | ICD-10-CM

## 2015-07-12 DIAGNOSIS — J4540 Moderate persistent asthma, uncomplicated: Secondary | ICD-10-CM | POA: Diagnosis not present

## 2015-07-12 MED ORDER — TOPIRAMATE 100 MG PO TABS: ORAL_TABLET | ORAL | Status: DC

## 2015-07-12 MED ORDER — FLUTICASONE FUROATE-VILANTEROL 100-25 MCG/INH IN AEPB: 1.0000 | INHALATION_SPRAY | Freq: Every day | RESPIRATORY_TRACT | Status: DC

## 2015-07-12 MED ORDER — PHENTERMINE HCL 37.5 MG PO TABS: 18.7500 mg | ORAL_TABLET | Freq: Every day | ORAL | Status: DC

## 2015-07-12 NOTE — Assessment & Plan Note (Signed)
Still with some daily breathing difficulty. Continues with Claritin and Singulair but was unable to afford Symbicort. Prescription given for Breo with coupon.

## 2015-07-12 NOTE — Assessment & Plan Note (Signed)
Total weight loss of over 50 pounds after 9 months, continuing with half dose phentermine. Continue Victoza and continue Topamax, doubling Topamax. Return in 3 months.

## 2015-07-12 NOTE — Progress Notes (Signed)
  Subjective:    CC: follow-up  HPI: Obesity: We have finished 9 months of phentermine, she's lost over 50 pounds. Continues to Victoza and Topamax.  Mild persistent asthma: Was unable to afford Symbicort, takes Claritin and Singulair daily. Still has some respiratory difficulty particularly now that the pollen is outside.  Past medical history, Surgical history, Family history not pertinant except as noted below, Social history, Allergies, and medications have been entered into the medical record, reviewed, and no changes needed.   Review of Systems: No fevers, chills, night sweats, weight loss, chest pain, or shortness of breath.   Objective:    General: Well Developed, well nourished, and in no acute distress.  Neuro: Alert and oriented x3, extra-ocular muscles intact, sensation grossly intact.  HEENT: Normocephalic, atraumatic, pupils equal round reactive to light, neck supple, no masses, no lymphadenopathy, thyroid nonpalpable.  Skin: Warm and dry, no rashes. Cardiac: Regular rate and rhythm, no murmurs rubs or gallops, no lower extremity edema.  Respiratory: Clear to auscultation bilaterally. Not using accessory muscles, speaking in full sentences.  Impression and Recommendations:    I spent 25 minutes with this patient, greater than 50% was face-to-face time counseling regarding the above diagnoses

## 2015-10-12 ENCOUNTER — Ambulatory Visit (INDEPENDENT_AMBULATORY_CARE_PROVIDER_SITE_OTHER): Payer: BC Managed Care – PPO | Admitting: Sports Medicine

## 2015-10-12 ENCOUNTER — Encounter: Payer: Self-pay | Admitting: Sports Medicine

## 2015-10-12 VITALS — BP 114/77 | HR 93 | Resp 18 | Wt 212.9 lb

## 2015-10-12 DIAGNOSIS — R635 Abnormal weight gain: Secondary | ICD-10-CM | POA: Diagnosis not present

## 2015-10-12 DIAGNOSIS — E669 Obesity, unspecified: Secondary | ICD-10-CM

## 2015-10-12 MED ORDER — LIRAGLUTIDE 18 MG/3ML ~~LOC~~ SOPN: 1.8000 mg | PEN_INJECTOR | Freq: Every day | SUBCUTANEOUS | Status: DC

## 2015-10-12 NOTE — Assessment & Plan Note (Signed)
Continue Topamax, Victoza. Discontinue phentermine, total weight loss of over 50 pounds after one year. I did give Contrave as an option, she will let me know, if this is too expensive we will use generic naltrexone and Wellbutrin. Return as needed.

## 2015-10-12 NOTE — Progress Notes (Signed)
  Subjective:    CC: Weight check  HPI: Virginia SagoSarah has returned, she has now finished a full year of phentermine, she is on Victoza and Topamax, over 50 pounds lost in continuing to lose slowly. Happy with results and feels very good. Not yet ready to add on another medication.  Past medical history, Surgical history, Family history not pertinant except as noted below, Social history, Allergies, and medications have been entered into the medical record, reviewed, and no changes needed.   Review of Systems: No fevers, chills, night sweats, weight loss, chest pain, or shortness of breath.   Objective:    General: Well Developed, well nourished, and in no acute distress.  Neuro: Alert and oriented x3, extra-ocular muscles intact, sensation grossly intact.  HEENT: Normocephalic, atraumatic, pupils equal round reactive to light, neck supple, no masses, no lymphadenopathy, thyroid nonpalpable.  Skin: Warm and dry, no rashes. Cardiac: Regular rate and rhythm, no murmurs rubs or gallops, no lower extremity edema.  Respiratory: Clear to auscultation bilaterally. Not using accessory muscles, speaking in full sentences.  Impression and Recommendations:

## 2015-12-01 ENCOUNTER — Telehealth: Payer: Self-pay

## 2015-12-01 MED ORDER — NALTREXONE-BUPROPION HCL ER 8-90 MG PO TB12: ORAL_TABLET | ORAL | 0 refills | Status: DC

## 2015-12-01 NOTE — Telephone Encounter (Signed)
Pt left VM stating she would like to try the medication that you recommended to her. Please assist.

## 2015-12-01 NOTE — Telephone Encounter (Signed)
Contrave sent in, she needs to print out coupon online before she goes to the pharmacy

## 2015-12-07 DIAGNOSIS — H00021 Hordeolum internum right upper eyelid: Secondary | ICD-10-CM | POA: Diagnosis not present

## 2016-01-06 ENCOUNTER — Telehealth: Payer: Self-pay | Admitting: *Deleted

## 2016-01-06 NOTE — Telephone Encounter (Signed)
KeyLazarus Salines: [id number] - PA Case : [id number]: [id number] PA initiated for contrave

## 2016-01-09 NOTE — Telephone Encounter (Signed)
Shona Simpsoncontrave has been approved from 01/06/2016-05/07/2016. Left message on pt vm and pharm vm.

## 2016-01-14 DIAGNOSIS — J45901 Unspecified asthma with (acute) exacerbation: Secondary | ICD-10-CM | POA: Diagnosis not present

## 2016-05-02 ENCOUNTER — Ambulatory Visit: Payer: Self-pay | Admitting: Sports Medicine

## 2016-05-07 ENCOUNTER — Ambulatory Visit (INDEPENDENT_AMBULATORY_CARE_PROVIDER_SITE_OTHER): Payer: BLUE CROSS/BLUE SHIELD | Admitting: Sports Medicine

## 2016-05-07 DIAGNOSIS — E6609 Other obesity due to excess calories: Secondary | ICD-10-CM | POA: Diagnosis not present

## 2016-05-07 DIAGNOSIS — M2242 Chondromalacia patellae, left knee: Secondary | ICD-10-CM | POA: Diagnosis not present

## 2016-05-07 MED ORDER — PHENTERMINE-TOPIRAMATE ER 3.75-23 MG PO CP24
1.0000 | ORAL_CAPSULE | Freq: Every morning | ORAL | 0 refills | Status: DC
Start: 2016-05-07 — End: 2016-06-15

## 2016-05-07 MED ORDER — PHENTERMINE-TOPIRAMATE ER 7.5-46 MG PO CP24: 1.0000 | ORAL_CAPSULE | Freq: Every morning | ORAL | 0 refills | Status: DC

## 2016-05-07 NOTE — Assessment & Plan Note (Signed)
Starting Qsymia. Declined referral to bariatric surgery.

## 2016-05-07 NOTE — Progress Notes (Signed)
  Subjective:    CC: Grinding on knee  HPI: This is a pleasant 31 year old female, for several months she's noted an increasing grinding sensation under her left kneecap, she declines any pain. No mechanical symptoms, no swelling.  Obesity: Agreeable to restart weight loss medication.  Past medical history:  Negative.  See flowsheet/record as well for more information.  Surgical history: Negative.  See flowsheet/record as well for more information.  Family history: Negative.  See flowsheet/record as well for more information.  Social history: Negative.  See flowsheet/record as well for more information.  Allergies, and medications have been entered into the medical record, reviewed, and no changes needed.   Review of Systems: No fevers, chills, night sweats, weight loss, chest pain, or shortness of breath.   Objective:    General: Well Developed, well nourished, and in no acute distress.  Neuro: Alert and oriented x3, extra-ocular muscles intact, sensation grossly intact.  HEENT: Normocephalic, atraumatic, pupils equal round reactive to light, neck supple, no masses, no lymphadenopathy, thyroid nonpalpable.  Skin: Warm and dry, no rashes. Cardiac: Regular rate and rhythm, no murmurs rubs or gallops, no lower extremity edema.  Respiratory: Clear to auscultation bilaterally. Not using accessory muscles, speaking in full sentences. Left Knee: Normal to inspection with no erythema or effusion or obvious bony abnormalities. Palpation normal with no warmth or joint line tenderness or patellar tenderness or condyle tenderness. ROM normal in flexion and extension and lower leg rotation. Ligaments with solid consistent endpoints including ACL, PCL, LCL, MCL. Negative Mcmurray's and provocative meniscal tests. Non painful patellar compression. Palpable patellar crepitus Patellar and quadriceps tendons unremarkable. Hamstring and quadriceps strength is normal.  Impression and  Recommendations:    Chondromalacia of patellofemoral joint, left Not having any pain but simply a grinding sensation. Rehabilitation exercises given, and I am going to help her with weight loss again.  Obesity Starting Qsymia. Declined referral to bariatric surgery.

## 2016-05-07 NOTE — Assessment & Plan Note (Signed)
Not having any pain but simply a grinding sensation. Rehabilitation exercises given, and I am going to help her with weight loss again.

## 2016-05-28 ENCOUNTER — Telehealth: Payer: Self-pay | Admitting: *Deleted

## 2016-05-28 DIAGNOSIS — E6609 Other obesity due to excess calories: Secondary | ICD-10-CM

## 2016-05-28 NOTE — Telephone Encounter (Signed)
Trying to initiate PA for qsymia. So is the patient to take both strengths of the qsymia, #14 for a total of 28 days?   PA submitted [id number]

## 2016-05-29 NOTE — Telephone Encounter (Signed)
Yes she is, that's the standard starting month for Qsymia, 2 different increasing doses to get the patient accustomed to the topiramate.

## 2016-06-04 ENCOUNTER — Ambulatory Visit: Payer: Self-pay | Admitting: Sports Medicine

## 2016-06-04 NOTE — Telephone Encounter (Signed)
Approvedon February 16  Effective from 06/01/2016 through 11/27/2016. Initial  Patient and pharm notified

## 2016-06-15 ENCOUNTER — Telehealth: Payer: Self-pay | Admitting: Sports Medicine

## 2016-06-15 MED ORDER — PHENTERMINE-TOPIRAMATE ER 3.75-23 MG PO CP24: 1.0000 | ORAL_CAPSULE | Freq: Every morning | ORAL | 0 refills | Status: DC

## 2016-06-15 NOTE — Telephone Encounter (Signed)
I did give her a 28 day supply, 14 days of the initial dose and 14 days of the subsequent dose.

## 2016-06-15 NOTE — Telephone Encounter (Signed)
They are so stupid.  That undermines the recommended 2 week initial dose before escalating to the subsequent dose.  However I have written a month of the initial dose and she will just have to up-taper more slowly.

## 2016-06-15 NOTE — Telephone Encounter (Signed)
Rx sent Pt advised 

## 2016-06-15 NOTE — Addendum Note (Signed)
Addended by: Monica BectonHEKKEKANDAM, Miley Lindon J on: 06/15/2016 03:43 PM   Modules accepted: Orders

## 2016-06-15 NOTE — Telephone Encounter (Signed)
Pt states she was advised by [facility] the Qsymia Rx needs to be a 28 day supply rather than a 14 day supply for discount card to work.

## 2016-08-31 DIAGNOSIS — T148XXA Other injury of unspecified body region, initial encounter: Secondary | ICD-10-CM | POA: Diagnosis not present

## 2016-08-31 DIAGNOSIS — S8992XA Unspecified injury of left lower leg, initial encounter: Secondary | ICD-10-CM | POA: Diagnosis not present

## 2016-08-31 DIAGNOSIS — M25572 Pain in left ankle and joints of left foot: Secondary | ICD-10-CM | POA: Diagnosis not present

## 2016-08-31 DIAGNOSIS — S99912A Unspecified injury of left ankle, initial encounter: Secondary | ICD-10-CM | POA: Diagnosis not present

## 2016-08-31 DIAGNOSIS — M7732 Calcaneal spur, left foot: Secondary | ICD-10-CM | POA: Diagnosis not present

## 2016-12-24 DIAGNOSIS — Z23 Encounter for immunization: Secondary | ICD-10-CM | POA: Diagnosis not present

## 2017-02-25 DIAGNOSIS — J209 Acute bronchitis, unspecified: Secondary | ICD-10-CM | POA: Diagnosis not present

## 2017-02-25 DIAGNOSIS — J0100 Acute maxillary sinusitis, unspecified: Secondary | ICD-10-CM | POA: Diagnosis not present

## 2017-05-27 DIAGNOSIS — J40 Bronchitis, not specified as acute or chronic: Secondary | ICD-10-CM | POA: Diagnosis not present

## 2017-05-27 DIAGNOSIS — J4521 Mild intermittent asthma with (acute) exacerbation: Secondary | ICD-10-CM | POA: Diagnosis not present

## 2017-06-13 DIAGNOSIS — J209 Acute bronchitis, unspecified: Secondary | ICD-10-CM | POA: Diagnosis not present

## 2017-08-21 ENCOUNTER — Ambulatory Visit: Payer: BLUE CROSS/BLUE SHIELD | Admitting: Sports Medicine

## 2017-08-21 VITALS — BP 118/82 | HR 98 | Resp 18 | Wt 286.0 lb

## 2017-08-21 DIAGNOSIS — Z23 Encounter for immunization: Secondary | ICD-10-CM | POA: Diagnosis not present

## 2017-08-21 DIAGNOSIS — Z1329 Encounter for screening for other suspected endocrine disorder: Secondary | ICD-10-CM | POA: Diagnosis not present

## 2017-08-21 DIAGNOSIS — E6609 Other obesity due to excess calories: Secondary | ICD-10-CM

## 2017-08-21 DIAGNOSIS — Z0000 Encounter for general adult medical examination without abnormal findings: Secondary | ICD-10-CM | POA: Diagnosis not present

## 2017-08-21 DIAGNOSIS — J4540 Moderate persistent asthma, uncomplicated: Secondary | ICD-10-CM | POA: Diagnosis not present

## 2017-08-21 MED ORDER — MONTELUKAST SODIUM 10 MG PO TABS: 10.0000 mg | ORAL_TABLET | Freq: Every day | ORAL | 3 refills | Status: DC

## 2017-08-21 MED ORDER — FLUTICASONE FUROATE-VILANTEROL 100-25 MCG/INH IN AEPB: 1.0000 | INHALATION_SPRAY | Freq: Every day | RESPIRATORY_TRACT | 11 refills | Status: DC

## 2017-08-21 MED ORDER — PHENTERMINE HCL 37.5 MG PO TABS: ORAL_TABLET | ORAL | 0 refills | Status: DC

## 2017-08-21 NOTE — Assessment & Plan Note (Signed)
Controlled, off of controller medications. Continue Claritin. Restarting Breo and Singulair. Return in 1 month.

## 2017-08-21 NOTE — Assessment & Plan Note (Signed)
Starting phentermine treatment, return monthly for weight checks and refills. Exercise prescription given. Qsymia was too expensive.

## 2017-08-21 NOTE — Progress Notes (Signed)
Subjective:    CC: Follow-up  HPI: Asthma: Would like to get back on a regimen, did better when on Breo, now with allergy season she is having a severe worsening of symptoms, has been to urgent care multiple times, x-rays were done that showed no infiltrate.  Increasing cough, shortness of breath, runny nose.  Currently using Claritin only.  Obesity: Would like to get started on weight loss treatment again.  Preventive measures: Due for Tdap, HIV screening, routine labs, cervical cancer screening.  I reviewed the past medical history, family history, social history, surgical history, and allergies today and no changes were needed.  Please see the problem list section below in epic for further details.  Past Medical History: No past medical history on file. Past Surgical History: Past Surgical History:  Procedure Laterality Date  . LEG SURGERY     Social History: Social History   Socioeconomic History  . Marital status: Single    Spouse name: Not on file  . Number of children: Not on file  . Years of education: Not on file  . Highest education level: Not on file  Occupational History  . Not on file  Social Needs  . Financial resource strain: Not on file  . Food insecurity:    Worry: Not on file    Inability: Not on file  . Transportation needs:    Medical: Not on file    Non-medical: Not on file  Tobacco Use  . Smoking status: Never Smoker  Substance and Sexual Activity  . Alcohol use: No  . Drug use: No  . Sexual activity: Not on file  Lifestyle  . Physical activity:    Days per week: Not on file    Minutes per session: Not on file  . Stress: Not on file  Relationships  . Social connections:    Talks on phone: Not on file    Gets together: Not on file    Attends religious service: Not on file    Active member of club or organization: Not on file    Attends meetings of clubs or organizations: Not on file    Relationship status: Not on file  Other Topics  Concern  . Not on file  Social History Narrative  . Not on file   Family History: Family History  Problem Relation Age of Onset  . Diabetes Mother   . Hypertension Mother   . Diabetes Father   . Hypertension Father   . Stroke Maternal Grandmother   . Stroke Maternal Grandfather    Allergies: Allergies  Allergen Reactions  . Ibuprofen Shortness Of Breath   Medications: See med rec.  Review of Systems: No fevers, chills, night sweats, weight loss, chest pain, or shortness of breath.   Objective:    General: Well Developed, well nourished, and in no acute distress.  Neuro: Alert and oriented x3, extra-ocular muscles intact, sensation grossly intact.  HEENT: Normocephalic, atraumatic, pupils equal round reactive to light, neck supple, no masses, no lymphadenopathy, thyroid nonpalpable.  Skin: Warm and dry, no rashes. Cardiac: Regular rate and rhythm, no murmurs rubs or gallops, no lower extremity edema.  Respiratory: Clear to auscultation bilaterally. Not using accessory muscles, speaking in full sentences.  Impression and Recommendations:    Asthma, moderate persistent Controlled, off of controller medications. Continue Claritin. Restarting Breo and Singulair. Return in 1 month.  Obesity Starting phentermine treatment, return monthly for weight checks and refills. Exercise prescription given. Qsymia was too expensive.  Annual physical  exam Checking routine labs, Tdap today, she will go to her OB/GYN, she is due for cervical cancer screening.  ___________________________________________ Ihor Austin. Benjamin Stain, M.D., ABFM., CAQSM. Primary Care and Sports Medicine [facility]  Adjunct Instructor of Family Medicine  [employer]

## 2017-08-21 NOTE — Assessment & Plan Note (Signed)
Checking routine labs, Tdap today, she will go to her OB/GYN, she is due for cervical cancer screening.

## 2017-08-21 NOTE — Addendum Note (Signed)
Addended by: Baird Kay on: 08/21/2017 09:49 AM   Modules accepted: Orders

## 2017-08-22 LAB — HIV ANTIBODY (ROUTINE TESTING W REFLEX): HIV 1&2 Ab, 4th Generation: NONREACTIVE

## 2017-08-22 LAB — CBC
HCT: 42.2 % (ref 35.0–45.0)
Hemoglobin: 14.2 g/dL (ref 11.7–15.5)
MCH: 26.3 pg — ABNORMAL LOW (ref 27.0–33.0)
MCHC: 33.6 g/dL (ref 32.0–36.0)
MCV: 78.3 fL — ABNORMAL LOW (ref 80.0–100.0)
MPV: 11.2 fL (ref 7.5–12.5)
Platelets: 237 10*3/uL (ref 140–400)
RBC: 5.39 10*6/uL — ABNORMAL HIGH (ref 3.80–5.10)
RDW: 13.9 % (ref 11.0–15.0)
WBC: 6.9 10*3/uL (ref 3.8–10.8)

## 2017-08-22 LAB — COMPREHENSIVE METABOLIC PANEL
ALT: 13 U/L (ref 6–29)
Albumin: 4.2 g/dL (ref 3.6–5.1)
Alkaline phosphatase (APISO): 72 U/L (ref 33–115)
BUN: 17 mg/dL (ref 7–25)
Chloride: 106 mmol/L (ref 98–110)
Creat: 0.73 mg/dL (ref 0.50–1.10)
Globulin: 2.5 g/dL (calc) (ref 1.9–3.7)
Glucose, Bld: 100 mg/dL — ABNORMAL HIGH (ref 65–99)
Potassium: 3.7 mmol/L (ref 3.5–5.3)
Total Protein: 6.7 g/dL (ref 6.1–8.1)

## 2017-08-22 LAB — LIPID PANEL W/REFLEX DIRECT LDL
Cholesterol: 178 mg/dL (ref <200)
HDL: 52 mg/dL (ref >50)
LDL Cholesterol (Calc): 110 mg/dL (calc) — ABNORMAL HIGH
Non-HDL Cholesterol (Calc): 126 mg/dL (ref <130)
Total CHOL/HDL Ratio: 3.4 (calc) (ref <5.0)
Triglycerides: 73 mg/dL (ref <150)

## 2017-08-22 LAB — COMPREHENSIVE METABOLIC PANEL WITH GFR
AG Ratio: 1.7 (calc) (ref 1.0–2.5)
AST: 14 U/L (ref 10–30)
CO2: 23 mmol/L (ref 20–32)
Calcium: 9.2 mg/dL (ref 8.6–10.2)
Sodium: 139 mmol/L (ref 135–146)
Total Bilirubin: 0.6 mg/dL (ref 0.2–1.2)

## 2017-08-22 LAB — HEMOGLOBIN A1C
Hgb A1c MFr Bld: 6.1 % of total Hgb — ABNORMAL HIGH (ref <5.7)
Mean Plasma Glucose: 128 (calc)
eAG (mmol/L): 7.1 (calc)

## 2017-08-22 LAB — TSH: TSH: 1.7 mIU/L

## 2017-09-18 ENCOUNTER — Encounter: Payer: Self-pay | Admitting: Sports Medicine

## 2017-09-18 ENCOUNTER — Ambulatory Visit: Payer: BLUE CROSS/BLUE SHIELD | Admitting: Sports Medicine

## 2017-09-18 DIAGNOSIS — J4540 Moderate persistent asthma, uncomplicated: Secondary | ICD-10-CM

## 2017-09-18 DIAGNOSIS — E6609 Other obesity due to excess calories: Secondary | ICD-10-CM | POA: Diagnosis not present

## 2017-09-18 DIAGNOSIS — Z0000 Encounter for general adult medical examination without abnormal findings: Secondary | ICD-10-CM

## 2017-09-18 MED ORDER — PHENTERMINE HCL 37.5 MG PO TABS: ORAL_TABLET | ORAL | 0 refills | Status: DC

## 2017-09-18 NOTE — Assessment & Plan Note (Signed)
19 pound weight loss after the first month and phentermine, refilling medication, return in a month.

## 2017-09-18 NOTE — Progress Notes (Signed)
Subjective:    CC: Weight check  HPI: Obesity: 19 pound weight loss after the first month on phentermine, no adverse effects.  Asthma: Now well controlled with loratadine, Singulair, Breo, no use of albuterol over the last month.  Annual preventive measures: Still due for cervical cancer screening but does have an appointment with her GYN coming up very soon.  I reviewed the past medical history, family history, social history, surgical history, and allergies today and no changes were needed.  Please see the problem list section below in epic for further details.  Past Medical History: No past medical history on file. Past Surgical History: Past Surgical History:  Procedure Laterality Date  . LEG SURGERY     Social History: Social History   Socioeconomic History  . Marital status: Single    Spouse name: Not on file  . Number of children: Not on file  . Years of education: Not on file  . Highest education level: Not on file  Occupational History  . Not on file  Social Needs  . Financial resource strain: Not on file  . Food insecurity:    Worry: Not on file    Inability: Not on file  . Transportation needs:    Medical: Not on file    Non-medical: Not on file  Tobacco Use  . Smoking status: Never Smoker  . Smokeless tobacco: Never Used  Substance and Sexual Activity  . Alcohol use: No  . Drug use: No  . Sexual activity: Not on file  Lifestyle  . Physical activity:    Days per week: Not on file    Minutes per session: Not on file  . Stress: Not on file  Relationships  . Social connections:    Talks on phone: Not on file    Gets together: Not on file    Attends religious service: Not on file    Active member of club or organization: Not on file    Attends meetings of clubs or organizations: Not on file    Relationship status: Not on file  Other Topics Concern  . Not on file  Social History Narrative  . Not on file   Family History: Family History    Problem Relation Age of Onset  . Diabetes Mother   . Hypertension Mother   . Diabetes Father   . Hypertension Father   . Stroke Maternal Grandmother   . Stroke Maternal Grandfather    Allergies: Allergies  Allergen Reactions  . Ibuprofen Shortness Of Breath   Medications: See med rec.  Review of Systems: No fevers, chills, night sweats, weight loss, chest pain, or shortness of breath.   Objective:    General: Well Developed, well nourished, and in no acute distress.  Neuro: Alert and oriented x3, extra-ocular muscles intact, sensation grossly intact.  HEENT: Normocephalic, atraumatic, pupils equal round reactive to light, neck supple, no masses, no lymphadenopathy, thyroid nonpalpable.  Skin: Warm and dry, no rashes. Cardiac: Regular rate and rhythm, no murmurs rubs or gallops, no lower extremity edema.  Respiratory: Clear to auscultation bilaterally. Not using accessory muscles, speaking in full sentences.  Impression and Recommendations:    Obesity 19 pound weight loss after the first month and phentermine, refilling medication, return in a month.  Asthma, moderate persistent Now controlled with Claritin, Breo, Singulair. No use of albuterol over the last month. No changes in regimen.  Annual physical exam Up-to-date on all screening measures except for cervical cancer, she does have an appointment  coming up with her OB/GYN. ___________________________________________ Ihor Austinhomas J. Benjamin Stainhekkekandam, M.D., ABFM., CAQSM. Primary Care and Sports Medicine [facility]  Adjunct Instructor of Family Medicine  [employer]

## 2017-09-18 NOTE — Assessment & Plan Note (Signed)
Now controlled with Claritin, Breo, Singulair. No use of albuterol over the last month. No changes in regimen.

## 2017-09-18 NOTE — Assessment & Plan Note (Signed)
Up-to-date on all screening measures except for cervical cancer, she does have an appointment coming up with her OB/GYN.

## 2017-09-30 ENCOUNTER — Ambulatory Visit: Payer: BLUE CROSS/BLUE SHIELD | Admitting: Family Medicine

## 2017-09-30 ENCOUNTER — Ambulatory Visit (INDEPENDENT_AMBULATORY_CARE_PROVIDER_SITE_OTHER): Payer: BLUE CROSS/BLUE SHIELD

## 2017-09-30 ENCOUNTER — Encounter: Payer: Self-pay | Admitting: Family Medicine

## 2017-09-30 VITALS — BP 134/75 | HR 90 | Ht 64.0 in | Wt 270.0 lb

## 2017-09-30 DIAGNOSIS — M25571 Pain in right ankle and joints of right foot: Secondary | ICD-10-CM

## 2017-09-30 MED ORDER — COLCHICINE 0.6 MG PO TABS: 0.6000 mg | ORAL_TABLET | Freq: Every day | ORAL | 2 refills | Status: DC | PRN

## 2017-09-30 MED ORDER — DICLOFENAC SODIUM 1 % TD GEL: 4.0000 g | Freq: Four times a day (QID) | TRANSDERMAL | 11 refills | Status: DC
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
## 2017-09-30 NOTE — Patient Instructions (Signed)
Thank you for coming in today. Use the boot as needed.  Use the diclofenac gel  Take colchince daily for 7 days. It is ok to stop colchicine if you feel better.  Recheck with me or Dr T in 2-3 weeks as scheduled.  Do not drive with the boot on your foot.

## 2017-10-02 ENCOUNTER — Encounter: Payer: Self-pay | Admitting: Obstetrics and Gynecology

## 2017-10-02 ENCOUNTER — Ambulatory Visit (INDEPENDENT_AMBULATORY_CARE_PROVIDER_SITE_OTHER): Payer: BLUE CROSS/BLUE SHIELD | Admitting: Obstetrics and Gynecology

## 2017-10-02 VITALS — BP 122/73 | HR 93 | Resp 16 | Ht 64.0 in | Wt 267.0 lb

## 2017-10-02 DIAGNOSIS — Z1151 Encounter for screening for human papillomavirus (HPV): Secondary | ICD-10-CM

## 2017-10-02 DIAGNOSIS — Z01419 Encounter for gynecological examination (general) (routine) without abnormal findings: Secondary | ICD-10-CM | POA: Diagnosis not present

## 2017-10-02 DIAGNOSIS — Z124 Encounter for screening for malignant neoplasm of cervix: Secondary | ICD-10-CM
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
## 2017-10-03 LAB — CYTOLOGY - PAP
ADEQUACY: ABSENT
Diagnosis: NEGATIVE
HPV: NOT DETECTED

## 2017-10-16 ENCOUNTER — Encounter: Payer: Self-pay | Admitting: Sports Medicine

## 2017-10-16 ENCOUNTER — Ambulatory Visit: Payer: BLUE CROSS/BLUE SHIELD | Admitting: Sports Medicine

## 2017-10-16 DIAGNOSIS — M19071 Primary osteoarthritis, right ankle and foot: Secondary | ICD-10-CM

## 2017-10-16 DIAGNOSIS — E6609 Other obesity due to excess calories: Secondary | ICD-10-CM

## 2017-10-16 MED ORDER — PHENTERMINE HCL 37.5 MG PO TABS: ORAL_TABLET | ORAL | 0 refills | Status: DC
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
## 2017-10-16 NOTE — Assessment & Plan Note (Signed)
With moderate tarsometatarsal bossing. Pain at the first tarsometatarsal joint, this is likely a synovitis/worsening of her osteoarthritis post [place]. injection at the medial cuneiform/first metatarsal joint. She needs to wear supportive shoes rather than flip-flops and return to see me in 1 month.

## 2017-10-16 NOTE — Assessment & Plan Note (Signed)
Nearly 10 pound additional weight loss after the second month, she lost 19 pounds the first month. Return in 1 month, refilling medication.

## 2017-10-21 ENCOUNTER — Encounter: Payer: Self-pay | Admitting: Sports Medicine

## 2017-11-18 ENCOUNTER — Ambulatory Visit: Payer: BLUE CROSS/BLUE SHIELD | Admitting: Sports Medicine

## 2017-11-18 ENCOUNTER — Encounter: Payer: Self-pay | Admitting: Sports Medicine

## 2017-11-18 DIAGNOSIS — M19071 Primary osteoarthritis, right ankle and foot: Secondary | ICD-10-CM | POA: Diagnosis not present

## 2017-11-18 DIAGNOSIS — E6609 Other obesity due to excess calories: Secondary | ICD-10-CM

## 2017-11-18 MED ORDER — PHENTERMINE HCL 37.5 MG PO TABS: ORAL_TABLET | ORAL | 0 refills | Status: DC

## 2017-11-18 NOTE — Assessment & Plan Note (Signed)
Moderate tarsometatarsal bossing and intertarsal and TMT osteoarthritis on x-ray. We did a TMT injection at the last visit. She took about 2 weeks to get better but her pain is significantly improved. Continues to wear unsupportive shoes, I have advised her to weigh out looking cute in flip-flops and decreasing foot pain with athletic shoes/rigid soled shoes, she will figure it out. Return as needed for this.

## 2017-11-18 NOTE — Assessment & Plan Note (Signed)
19 pounds the first month, 10 pounds the second month, 16 pounds last month, fantastic weight loss. Refilling phentermine as we enter the fourth month.
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
## 2017-12-19 ENCOUNTER — Ambulatory Visit (INDEPENDENT_AMBULATORY_CARE_PROVIDER_SITE_OTHER): Payer: BLUE CROSS/BLUE SHIELD | Admitting: Sports Medicine

## 2017-12-19 ENCOUNTER — Encounter: Payer: Self-pay | Admitting: Sports Medicine

## 2017-12-19 DIAGNOSIS — E6609 Other obesity due to excess calories: Secondary | ICD-10-CM | POA: Diagnosis not present

## 2017-12-19 MED ORDER — PHENTERMINE HCL 37.5 MG PO TABS: ORAL_TABLET | ORAL | 0 refills | Status: DC
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
## 2017-12-19 NOTE — Assessment & Plan Note (Signed)
Good additional weight loss, 19 pounds the first month, 10 pounds the second month, 16 pounds the third month and 9 pounds the fourth month. This brings total weight loss to 54 pounds. Entering the fifth month, 2 more months of full dose phentermine and then we will taper down, I can add Topamax should she plateau or gain.

## 2018-01-17 ENCOUNTER — Ambulatory Visit: Payer: BLUE CROSS/BLUE SHIELD | Admitting: Sports Medicine

## 2018-01-17 DIAGNOSIS — M19079 Primary osteoarthritis, unspecified ankle and foot: Secondary | ICD-10-CM | POA: Diagnosis not present

## 2018-01-17 DIAGNOSIS — M19071 Primary osteoarthritis, right ankle and foot: Secondary | ICD-10-CM | POA: Diagnosis not present

## 2018-01-17 DIAGNOSIS — E6609 Other obesity due to excess calories: Secondary | ICD-10-CM | POA: Diagnosis not present

## 2018-01-17 NOTE — Progress Notes (Signed)
Subjective:    CC: Several issues  HPI: Obesity: Good continued weight loss, entering the sixth month.  Right foot pain: With TMT osteoarthritis, tarsometatarsal bossing.  We did a tarsometatarsal injection 2 months ago, initial good response but now with recurrence of pain, moderate, persistent, localized over the dorsal midfoot without radiation, no trauma.  I reviewed the past medical history, family history, social history, surgical history, and allergies today and no changes were needed.  Please see the problem list section below in epic for further details.  Past Medical History: Past Medical History:  Diagnosis Date  . Asthma, moderate persistent 10/28/2014  . Obesity 10/28/2014  . Tibia/fibula fracture r   Past Surgical History: Past Surgical History:  Procedure Laterality Date  . IM NAILING TIBIA Right    Social History: Social History   Socioeconomic History  . Marital status: Single    Spouse name: Not on file  . Number of children: Not on file  . Years of education: Not on file  . Highest education level: Not on file  Occupational History  . Not on file  Social Needs  . Financial resource strain: Not on file  . Food insecurity:    Worry: Not on file    Inability: Not on file  . Transportation needs:    Medical: Not on file    Non-medical: Not on file  Tobacco Use  . Smoking status: Never Smoker  . Smokeless tobacco: Never Used  Substance and Sexual Activity  . Alcohol use: No  . Drug use: No  . Sexual activity: Not Currently    Birth control/protection: None  Lifestyle  . Physical activity:    Days per week: Not on file    Minutes per session: Not on file  . Stress: Not on file  Relationships  . Social connections:    Talks on phone: Not on file    Gets together: Not on file    Attends religious service: Not on file    Active member of club or organization: Not on file    Attends meetings of clubs or organizations: Not on file    Relationship  status: Not on file  Other Topics Concern  . Not on file  Social History Narrative  . Not on file   Family History: Family History  Problem Relation Age of Onset  . Diabetes Mother   . Hypertension Mother   . Diabetes Father   . Hypertension Father   . Stroke Maternal Grandmother   . Stroke Maternal Grandfather    Allergies: Allergies  Allergen Reactions  . Ibuprofen Shortness Of Breath   Medications: See med rec.  Review of Systems: No fevers, chills, night sweats, weight loss, chest pain, or shortness of breath.   Objective:    General: Well Developed, well nourished, and in no acute distress.  Neuro: Alert and oriented x3, extra-ocular muscles intact, sensation grossly intact.  HEENT: Normocephalic, atraumatic, pupils equal round reactive to light, neck supple, no masses, no lymphadenopathy, thyroid nonpalpable.  Skin: Warm and dry, no rashes. Cardiac: Regular rate and rhythm, no murmurs rubs or gallops, no lower extremity edema.  Respiratory: Clear to auscultation bilaterally. Not using accessory muscles, speaking in full sentences.  Impression and Recommendations:    Obesity 6 additional pounds, she has thus lost 19 pounds the first month, 10 pounds the second, 16 pounds the third, 9 pounds the fourth and 6 pounds the fifth month. Weight loss is now 60 pounds after 5 months of  phentermine. One more month of full dose and then we will taper down, can always add Topamax should she plateau or gain.  Primary osteoarthritis of right foot Moderate tarsometatarsal bossing and intertarsal as well as TMT osteoarthritis on x-rays. We did a TMT injection about 2 months ago, pain significantly improved after about 2 weeks. Unfortunately his pain is now returning. It we are going to obtain an MRI of the right foot for further evaluation. If arthritis is the problem then we probably need to do a referral to orthopedic foot and ankle surgery for consideration of arthrodesis. If  stress injury we will place her in a boot or postop shoe.  ___________________________________________ Ihor Austin. Benjamin Stain, M.D., ABFM., CAQSM. Primary Care and Sports Medicine [facility]  Adjunct Instructor of Family Medicine  [employer]

## 2018-01-17 NOTE — Assessment & Plan Note (Signed)
Moderate tarsometatarsal bossing and intertarsal as well as TMT osteoarthritis on x-rays. We did a TMT injection about 2 months ago, pain significantly improved after about 2 weeks. Unfortunately his pain is now returning. It we are going to obtain an MRI of the right foot for further evaluation. If arthritis is the problem then we probably need to do a referral to orthopedic foot and ankle surgery for consideration of arthrodesis. If stress injury we will place her in a boot or postop shoe.

## 2018-01-17 NOTE — Assessment & Plan Note (Signed)
6 additional pounds, she has thus lost 19 pounds the first month, 10 pounds the second, 16 pounds the third, 9 pounds the fourth and 6 pounds the fifth month. Weight loss is now 60 pounds after 5 months of phentermine. One more month of full dose and then we will taper down, can always add Topamax should she plateau or gain.

## 2018-01-28 ENCOUNTER — Other Ambulatory Visit: Payer: Self-pay | Admitting: Sports Medicine

## 2018-01-28 DIAGNOSIS — E6609 Other obesity due to excess calories: Secondary | ICD-10-CM

## 2018-02-03 ENCOUNTER — Ambulatory Visit (INDEPENDENT_AMBULATORY_CARE_PROVIDER_SITE_OTHER): Payer: BLUE CROSS/BLUE SHIELD

## 2018-02-03 DIAGNOSIS — M19071 Primary osteoarthritis, right ankle and foot: Secondary | ICD-10-CM

## 2018-02-03 DIAGNOSIS — M19079 Primary osteoarthritis, unspecified ankle and foot: Secondary | ICD-10-CM

## 2018-02-13 ENCOUNTER — Ambulatory Visit: Payer: BLUE CROSS/BLUE SHIELD | Admitting: Sports Medicine

## 2018-02-13 ENCOUNTER — Encounter: Payer: Self-pay | Admitting: Sports Medicine

## 2018-02-13 DIAGNOSIS — E6609 Other obesity due to excess calories: Secondary | ICD-10-CM | POA: Diagnosis not present

## 2018-02-13 DIAGNOSIS — M19071 Primary osteoarthritis, right ankle and foot: Secondary | ICD-10-CM | POA: Diagnosis not present

## 2018-02-13 MED ORDER — PHENTERMINE HCL 37.5 MG PO TABS: ORAL_TABLET | ORAL | 0 refills | Status: DC

## 2018-02-13 NOTE — Assessment & Plan Note (Signed)
MRI showed stress related changes in these middle cuneiform and second metatarsal. She got her boot a week ago, continue boot with the orthotic and it for a full month before considering referral to foot and ankle surgery for consideration of arthrodesis.

## 2018-02-13 NOTE — Progress Notes (Signed)
Subjective:    CC: Follow-up  HPI: This is a pleasant 32 year old female, she has now lost over 60 pounds.  She continued to have foot pain, we obtained an MRI that showed degenerative changes as well as bone marrow edema in the middle cuneiform as well as the second metatarsal base consistent with stress fracture.  She has been wearing her boot for a week but without her orthotic in it.  I reviewed the past medical history, family history, social history, surgical history, and allergies today and no changes were needed.  Please see the problem list section below in epic for further details.  Past Medical History: Past Medical History:  Diagnosis Date  . Asthma, moderate persistent 10/28/2014  . Obesity 10/28/2014  . Tibia/fibula fracture r   Past Surgical History: Past Surgical History:  Procedure Laterality Date  . IM NAILING TIBIA Right    Social History: Social History   Socioeconomic History  . Marital status: Single    Spouse name: Not on file  . Number of children: Not on file  . Years of education: Not on file  . Highest education level: Not on file  Occupational History  . Not on file  Social Needs  . Financial resource strain: Not on file  . Food insecurity:    Worry: Not on file    Inability: Not on file  . Transportation needs:    Medical: Not on file    Non-medical: Not on file  Tobacco Use  . Smoking status: Never Smoker  . Smokeless tobacco: Never Used  Substance and Sexual Activity  . Alcohol use: No  . Drug use: No  . Sexual activity: Not Currently    Birth control/protection: None  Lifestyle  . Physical activity:    Days per week: Not on file    Minutes per session: Not on file  . Stress: Not on file  Relationships  . Social connections:    Talks on phone: Not on file    Gets together: Not on file    Attends religious service: Not on file    Active member of club or organization: Not on file    Attends meetings of clubs or organizations:  Not on file    Relationship status: Not on file  Other Topics Concern  . Not on file  Social History Narrative  . Not on file   Family History: Family History  Problem Relation Age of Onset  . Diabetes Mother   . Hypertension Mother   . Diabetes Father   . Hypertension Father   . Stroke Maternal Grandmother   . Stroke Maternal Grandfather    Allergies: Allergies  Allergen Reactions  . Ibuprofen Shortness Of Breath   Medications: See med rec.  Review of Systems: No fevers, chills, night sweats, weight loss, chest pain, or shortness of breath.   Objective:    General: Well Developed, well nourished, and in no acute distress.  Neuro: Alert and oriented x3, extra-ocular muscles intact, sensation grossly intact.  HEENT: Normocephalic, atraumatic, pupils equal round reactive to light, neck supple, no masses, no lymphadenopathy, thyroid nonpalpable.  Skin: Warm and dry, no rashes. Cardiac: Regular rate and rhythm, no murmurs rubs or gallops, no lower extremity edema.  Respiratory: Clear to auscultation bilaterally. Not using accessory muscles, speaking in full sentences.  Impression and Recommendations:    Obesity Good continued additional weight loss, weight loss is now over 60 pounds after the 6 months. Refilling phentermine, half dose for 3 months.  We can see her again in 3 months for her weight and either discontinue or give another 3 months of half dose phentermine +/- Topamax.  Primary osteoarthritis of right foot MRI showed stress related changes in these middle cuneiform and second metatarsal. She got her boot a week ago, continue boot with the orthotic and it for a full month before considering referral to foot and ankle surgery for consideration of arthrodesis. ___________________________________________ Ihor Austin. Benjamin Stain, M.D., ABFM., CAQSM. Primary Care and Sports Medicine [facility]  Adjunct Professor of Family Medicine  [employer]

## 2018-02-13 NOTE — Assessment & Plan Note (Signed)
Good continued additional weight loss, weight loss is now over 60 pounds after the 6 months. Refilling phentermine, half dose for 3 months. We can see her again in 3 months for her weight and either discontinue or give another 3 months of half dose phentermine +/- Topamax.

## 2018-02-14 ENCOUNTER — Ambulatory Visit: Payer: BLUE CROSS/BLUE SHIELD | Admitting: Sports Medicine

## 2018-03-10 ENCOUNTER — Encounter: Payer: Self-pay | Admitting: Sports Medicine

## 2018-03-10 ENCOUNTER — Ambulatory Visit: Payer: BLUE CROSS/BLUE SHIELD | Admitting: Sports Medicine

## 2018-03-10 DIAGNOSIS — M19071 Primary osteoarthritis, right ankle and foot: Secondary | ICD-10-CM

## 2018-03-10 MED ORDER — ACETAMINOPHEN ER 650 MG PO TBCR: 1300.0000 mg | EXTENDED_RELEASE_TABLET | Freq: Two times a day (BID) | ORAL | 3 refills | Status: DC

## 2018-03-10 NOTE — Progress Notes (Signed)
Subjective:    CC: Follow-up  HPI: Foot pain: With extensive subtalar osteoarthritis, she has had multiple nonoperative treatment measures at this point, custom orthotics, NSAIDs, aggressive weight loss of 60 pounds with phentermine, intertarsal joint injection under ultrasound guidance that provided only 2 weeks of relief.  More recently 1 month of boot immobilization, none of the above provided much long-lasting relief.  I reviewed the past medical history, family history, social history, surgical history, and allergies today and no changes were needed.  Please see the problem list section below in epic for further details.  Past Medical History: Past Medical History:  Diagnosis Date  . Asthma, moderate persistent 10/28/2014  . Obesity 10/28/2014  . Tibia/fibula fracture r   Past Surgical History: Past Surgical History:  Procedure Laterality Date  . IM NAILING TIBIA Right    Social History: Social History   Socioeconomic History  . Marital status: Single    Spouse name: Not on file  . Number of children: Not on file  . Years of education: Not on file  . Highest education level: Not on file  Occupational History  . Not on file  Social Needs  . Financial resource strain: Not on file  . Food insecurity:    Worry: Not on file    Inability: Not on file  . Transportation needs:    Medical: Not on file    Non-medical: Not on file  Tobacco Use  . Smoking status: Never Smoker  . Smokeless tobacco: Never Used  Substance and Sexual Activity  . Alcohol use: No  . Drug use: No  . Sexual activity: Not Currently    Birth control/protection: None  Lifestyle  . Physical activity:    Days per week: Not on file    Minutes per session: Not on file  . Stress: Not on file  Relationships  . Social connections:    Talks on phone: Not on file    Gets together: Not on file    Attends religious service: Not on file    Active member of club or organization: Not on file    Attends  meetings of clubs or organizations: Not on file    Relationship status: Not on file  Other Topics Concern  . Not on file  Social History Narrative  . Not on file   Family History: Family History  Problem Relation Age of Onset  . Diabetes Mother   . Hypertension Mother   . Diabetes Father   . Hypertension Father   . Stroke Maternal Grandmother   . Stroke Maternal Grandfather    Allergies: Allergies  Allergen Reactions  . Ibuprofen Shortness Of Breath   Medications: See med rec.  Review of Systems: No fevers, chills, night sweats, weight loss, chest pain, or shortness of breath.   Objective:    General: Well Developed, well nourished, and in no acute distress.  Neuro: Alert and oriented x3, extra-ocular muscles intact, sensation grossly intact.  HEENT: Normocephalic, atraumatic, pupils equal round reactive to light, neck supple, no masses, no lymphadenopathy, thyroid nonpalpable.  Skin: Warm and dry, no rashes. Cardiac: Regular rate and rhythm, no murmurs rubs or gallops, no lower extremity edema.  Respiratory: Clear to auscultation bilaterally. Not using accessory muscles, speaking in full sentences.  Impression and Recommendations:    Primary osteoarthritis of right foot MRI showed stress related changes in the middle cuneiform, second metatarsal. We have done the boot and orthotic for over a month now, persistent discomfort. We did a  tarsometatarsal joint ultrasound-guided injection that provided only 2 weeks of relief. Increasing medication to arthritis strength Tylenol 2 tabs twice a day. Referral to foot and ankle surgery for consideration of arthrodesis. ___________________________________________ Ihor Austin. Benjamin Stain, M.D., ABFM., CAQSM. Primary Care and Sports Medicine [facility]  Adjunct Professor of Family Medicine  [employer]

## 2018-03-10 NOTE — Assessment & Plan Note (Signed)
MRI showed stress related changes in the middle cuneiform, second metatarsal. We have done the boot and orthotic for over a month now, persistent discomfort. We did a tarsometatarsal joint ultrasound-guided injection that provided only 2 weeks of relief. Increasing medication to arthritis strength Tylenol 2 tabs twice a day. Referral to foot and ankle surgery for consideration of arthrodesis.

## 2018-03-17 DIAGNOSIS — M19071 Primary osteoarthritis, right ankle and foot: Secondary | ICD-10-CM | POA: Diagnosis not present

## 2018-03-18 ENCOUNTER — Other Ambulatory Visit: Payer: Self-pay | Admitting: Orthopaedic Surgery

## 2018-03-18 DIAGNOSIS — M79671 Pain in right foot: Secondary | ICD-10-CM

## 2018-03-21 ENCOUNTER — Ambulatory Visit
Admission: RE | Admit: 2018-03-21 | Discharge: 2018-03-21 | Disposition: A | Discharge status: Home / Self Care | Payer: BLUE CROSS/BLUE SHIELD | Source: Ambulatory Visit | Attending: Orthopaedic Surgery | Admitting: Orthopaedic Surgery

## 2018-03-21 DIAGNOSIS — M19071 Primary osteoarthritis, right ankle and foot: Secondary | ICD-10-CM | POA: Diagnosis not present

## 2018-03-21 DIAGNOSIS — M79671 Pain in right foot: Secondary | ICD-10-CM

## 2018-03-24 ENCOUNTER — Encounter: Payer: Self-pay | Admitting: Sports Medicine

## 2018-03-24 MED ORDER — TOPIRAMATE 50 MG PO TABS: ORAL_TABLET | ORAL | 0 refills | Status: DC

## 2018-03-27 DIAGNOSIS — M19071 Primary osteoarthritis, right ankle and foot: Secondary | ICD-10-CM | POA: Diagnosis not present

## 2018-04-02 ENCOUNTER — Other Ambulatory Visit: Payer: Self-pay | Admitting: Orthopedic Surgery

## 2018-04-04 NOTE — Pre-Procedure Instructions (Signed)
Virginia MccallumSarah L May  04/04/2018      [id number] - [place], [place] - [address] [place] [id number] Phone: [phone] Fax: [phone]    Your procedure is scheduled on Thurs., Jan. 2, 2020 from 7:30AM-11:00AM  [facility]" at 5:30AM  Call this number if you have problems the morning of surgery:  [phone]   Remember:  Do not eat or drink after midnight on Jan. 1st    Take these medicines the morning of surgery with A SIP OF WATER: Loratadine (CLARITIN), Topiramate (TOPAMAX), and Fluticasone furoate-vilanterol (BREO ELLIPTA)   If needed: Acetaminophen (TYLENOL), EPINEPHrine (EPIPEN 2-PAK), and Albuterol (PROVENTIL HFA;VENTOLIN HFA)-bring with you the day of surgery   7 days before surgery (04/10/18), stop taking all Other Aspirin Products, Vitamins, Fish oils, and Herbal medications. Also stop all NSAIDS i.e. Advil, Ibuprofen, Motrin, Aleve, Anaprox, Naproxen, BC, Goody Powders, and all Supplements. Including: Phentermine (ADIPEX-P)     Do not wear jewelry, make-up or nail polish.  Do not wear lotions, powders, or perfumes, or deodorant.  Do not shave 48 hours prior to surgery.   Do not bring valuables to the hospital.  [facility] is not responsible for any belongings or valuables.  Contacts, dentures or bridgework may not be worn into surgery.  Leave your suitcase in the car.  After surgery it may be brought to your room.  For patients admitted to the hospital, discharge time will be determined by your treatment team.  Patients discharged the day of surgery will not be allowed to drive home.   Special instructions: [place]- Preparing For Surgery  Before surgery, you can play an important role. Because skin is not sterile, your skin needs to be as free of germs as possible. You can reduce the number of germs on your skin by washing with CHG (chlorahexidine gluconate) Soap before  surgery.  CHG is an antiseptic cleaner which kills germs and bonds with the skin to continue killing germs even after washing.    Oral Hygiene is also important to reduce your risk of infection.  Remember - BRUSH YOUR TEETH THE MORNING OF SURGERY WITH YOUR REGULAR TOOTHPASTE  Please do not use if you have an allergy to CHG or antibacterial soaps. If your skin becomes reddened/irritated stop using the CHG.  Do not shave (including legs and underarms) for at least 48 hours prior to first CHG shower. It is OK to shave your face.  Please follow these instructions carefully.   1. Shower the NIGHT BEFORE SURGERY and the MORNING OF SURGERY with CHG.   2. If you chose to wash your hair, wash your hair first as usual with your normal shampoo.  3. After you shampoo, rinse your hair and body thoroughly to remove the shampoo.  4. Use CHG as you would any other liquid soap. You can apply CHG directly to the skin and wash gently with a scrungie or a clean washcloth.   5. Apply the CHG Soap to your body ONLY FROM THE NECK DOWN.  Do not use on open wounds or open sores. Avoid contact with your eyes, ears, mouth and genitals (private parts). Wash Face and genitals (private parts)  with your normal soap.  6. Wash thoroughly, paying special attention to the area where your surgery will be performed.  7. Thoroughly rinse your body with warm water from the neck down.  8. DO NOT shower/wash with your normal  soap after using and rinsing off the CHG Soap.  9. Pat yourself dry with a CLEAN TOWEL.  10. Wear CLEAN PAJAMAS to bed the night before surgery, wear comfortable clothes the morning of surgery  11. Place CLEAN SHEETS on your bed the night of your first shower and DO NOT SLEEP WITH PETS.  Day of Surgery:  Do not apply any deodorants/lotions.  Please wear clean clothes to the hospital/surgery center.   Remember to brush your teeth WITH YOUR REGULAR TOOTHPASTE.  Please read over the following fact  sheets that you were given. Pain Booklet, Coughing and Deep Breathing and Surgical Site Infection Prevention

## 2018-04-07 ENCOUNTER — Other Ambulatory Visit: Payer: Self-pay

## 2018-04-07 ENCOUNTER — Encounter (HOSPITAL_COMMUNITY)
Admission: RE | Admit: 2018-04-07 | Discharge: 2018-04-07 | Disposition: A | Discharge status: Home / Self Care | Payer: BLUE CROSS/BLUE SHIELD | Source: Ambulatory Visit | Attending: Orthopaedic Surgery | Admitting: Orthopaedic Surgery

## 2018-04-07 ENCOUNTER — Encounter (HOSPITAL_COMMUNITY): Payer: Self-pay

## 2018-04-07 DIAGNOSIS — Z01812 Encounter for preprocedural laboratory examination: Secondary | ICD-10-CM | POA: Insufficient documentation

## 2018-04-07 LAB — BASIC METABOLIC PANEL
ANION GAP: 8 (ref 5–15)
BUN: 18 mg/dL (ref 6–20)
CO2: 22 mmol/L (ref 22–32)
Calcium: 9.2 mg/dL (ref 8.9–10.3)
Chloride: 107 mmol/L (ref 98–111)
Creatinine, Ser: 0.83 mg/dL (ref 0.44–1.00)
GFR calc Af Amer: >60 mL/min (ref >60)
GFR calc non Af Amer: >60 mL/min (ref >60)
GLUCOSE: 95 mg/dL (ref 70–99)
Potassium: 4 mmol/L (ref 3.5–5.1)
Sodium: 137 mmol/L (ref 135–145)

## 2018-04-07 LAB — CBC
HCT: 46.5 % — ABNORMAL HIGH (ref 36.0–46.0)
Hemoglobin: 14.8 g/dL (ref 12.0–15.0)
MCH: 27.5 pg (ref 26.0–34.0)
MCHC: 31.8 g/dL (ref 30.0–36.0)
MCV: 86.4 fL (ref 80.0–100.0)
Platelets: 258 10*3/uL (ref 150–400)
RBC: 5.38 MIL/uL — AB (ref 3.87–5.11)
RDW: 13.2 % (ref 11.5–15.5)
WBC: 6 10*3/uL (ref 4.0–10.5)
nRBC: 0 % (ref 0.0–0.2)

## 2018-04-07 NOTE — Progress Notes (Signed)
PCP: Rodney Langtonhomas Thekkekandam  DM: denies  SA: denies  Pt denies SOB, cough, fever, chest pain @ PAT appointment  Pt stated understanding of instructions given for DOS.

## 2018-04-15 ENCOUNTER — Other Ambulatory Visit: Payer: Self-pay | Admitting: Sports Medicine

## 2018-04-17 ENCOUNTER — Other Ambulatory Visit: Payer: Self-pay

## 2018-04-17 ENCOUNTER — Encounter (HOSPITAL_COMMUNITY): Payer: Self-pay | Admitting: *Deleted

## 2018-04-17 ENCOUNTER — Ambulatory Visit (HOSPITAL_COMMUNITY)
Admission: RE | Admit: 2018-04-17 | Discharge: 2018-04-17 | Disposition: A | Discharge status: Home / Self Care | Payer: BLUE CROSS/BLUE SHIELD | Attending: Orthopaedic Surgery | Admitting: Orthopaedic Surgery

## 2018-04-17 ENCOUNTER — Encounter (HOSPITAL_COMMUNITY)
Admission: RE | Disposition: A | Discharge status: Home / Self Care | Payer: Self-pay | Source: Home / Self Care | Attending: Orthopaedic Surgery

## 2018-04-17 ENCOUNTER — Ambulatory Visit (HOSPITAL_COMMUNITY): Payer: BLUE CROSS/BLUE SHIELD | Admitting: Certified Registered Nurse Anesthetist

## 2018-04-17 DIAGNOSIS — X58XXXA Exposure to other specified factors, initial encounter: Secondary | ICD-10-CM | POA: Diagnosis not present

## 2018-04-17 DIAGNOSIS — S93491S Sprain of other ligament of right ankle, sequela: Secondary | ICD-10-CM | POA: Diagnosis not present

## 2018-04-17 DIAGNOSIS — Z8249 Family history of ischemic heart disease and other diseases of the circulatory system: Secondary | ICD-10-CM | POA: Diagnosis not present

## 2018-04-17 DIAGNOSIS — S93321S Subluxation of tarsometatarsal joint of right foot, sequela: Secondary | ICD-10-CM | POA: Diagnosis not present

## 2018-04-17 DIAGNOSIS — Z79899 Other long term (current) drug therapy: Secondary | ICD-10-CM | POA: Diagnosis not present

## 2018-04-17 DIAGNOSIS — Z7951 Long term (current) use of inhaled steroids: Secondary | ICD-10-CM | POA: Insufficient documentation

## 2018-04-17 DIAGNOSIS — Y9389 Activity, other specified: Secondary | ICD-10-CM | POA: Insufficient documentation

## 2018-04-17 DIAGNOSIS — Z886 Allergy status to analgesic agent status: Secondary | ICD-10-CM | POA: Diagnosis not present

## 2018-04-17 DIAGNOSIS — M898X7 Other specified disorders of bone, ankle and foot: Secondary | ICD-10-CM | POA: Insufficient documentation

## 2018-04-17 DIAGNOSIS — M93271 Osteochondritis dissecans, right ankle and joints of right foot: Secondary | ICD-10-CM | POA: Diagnosis not present

## 2018-04-17 DIAGNOSIS — S92311P Displaced fracture of first metatarsal bone, right foot, subsequent encounter for fracture with malunion: Secondary | ICD-10-CM | POA: Diagnosis not present

## 2018-04-17 DIAGNOSIS — Z833 Family history of diabetes mellitus: Secondary | ICD-10-CM | POA: Diagnosis not present

## 2018-04-17 DIAGNOSIS — T84098A Other mechanical complication of other internal joint prosthesis, initial encounter: Secondary | ICD-10-CM | POA: Diagnosis not present

## 2018-04-17 DIAGNOSIS — M19071 Primary osteoarthritis, right ankle and foot: Secondary | ICD-10-CM | POA: Insufficient documentation

## 2018-04-17 DIAGNOSIS — J45909 Unspecified asthma, uncomplicated: Secondary | ICD-10-CM | POA: Insufficient documentation

## 2018-04-17 DIAGNOSIS — S92311A Displaced fracture of first metatarsal bone, right foot, initial encounter for closed fracture: Secondary | ICD-10-CM | POA: Diagnosis not present

## 2018-04-17 DIAGNOSIS — G8918 Other acute postprocedural pain: Secondary | ICD-10-CM | POA: Diagnosis not present

## 2018-04-17 DIAGNOSIS — M25871 Other specified joint disorders, right ankle and foot: Secondary | ICD-10-CM | POA: Insufficient documentation

## 2018-04-17 DIAGNOSIS — Z823 Family history of stroke: Secondary | ICD-10-CM | POA: Insufficient documentation

## 2018-04-17 DIAGNOSIS — M25371 Other instability, right ankle: Secondary | ICD-10-CM | POA: Diagnosis not present

## 2018-04-17 DIAGNOSIS — S93321A Subluxation of tarsometatarsal joint of right foot, initial encounter: Secondary | ICD-10-CM | POA: Diagnosis not present

## 2018-04-17 DIAGNOSIS — M65871 Other synovitis and tenosynovitis, right ankle and foot: Secondary | ICD-10-CM | POA: Insufficient documentation

## 2018-04-17 DIAGNOSIS — S93401A Sprain of unspecified ligament of right ankle, initial encounter: Secondary | ICD-10-CM | POA: Insufficient documentation

## 2018-04-17 DIAGNOSIS — T8484XA Pain due to internal orthopedic prosthetic devices, implants and grafts, initial encounter: Secondary | ICD-10-CM | POA: Diagnosis not present

## 2018-04-17 HISTORY — PX: ARTHRODESIS METATARSAL: SHX6565

## 2018-04-17 LAB — POCT PREGNANCY, URINE: Preg Test, Ur: NEGATIVE

## 2018-04-17 SURGERY — FUSION, JOINT, INVOLVING METATARSAL BONE
Anesthesia: General | Site: Leg Lower | Laterality: Right

## 2018-04-17 MED ORDER — 0.9 % SODIUM CHLORIDE (POUR BTL) OPTIME
TOPICAL | Status: DC | PRN
  Administered 2018-04-17: 1000 mL

## 2018-04-17 MED ORDER — HYDROMORPHONE HCL 1 MG/ML IJ SOLN
INTRAMUSCULAR | Status: AC
  Administered 2018-04-17: 0.5 mg via INTRAVENOUS
  Filled 2018-04-17: qty 1

## 2018-04-17 MED ORDER — OXYCODONE HCL 5 MG PO TABS: 5.0000 mg | ORAL_TABLET | ORAL | 0 refills | Status: AC | PRN

## 2018-04-17 MED ORDER — ONDANSETRON HCL 4 MG/2ML IJ SOLN
INTRAMUSCULAR | Status: AC
  Filled 2018-04-17: qty 2

## 2018-04-17 MED ORDER — FENTANYL CITRATE (PF) 250 MCG/5ML IJ SOLN
INTRAMUSCULAR | Status: AC
  Filled 2018-04-17: qty 5

## 2018-04-17 MED ORDER — PROMETHAZINE HCL 25 MG/ML IJ SOLN
12.5000 mg | Freq: Once | INTRAMUSCULAR | Status: AC
  Administered 2018-04-17: 12.5 mg via INTRAVENOUS

## 2018-04-17 MED ORDER — ROCURONIUM BROMIDE 50 MG/5ML IV SOSY
PREFILLED_SYRINGE | INTRAVENOUS | Status: AC
  Filled 2018-04-17: qty 5

## 2018-04-17 MED ORDER — ONDANSETRON HCL 4 MG/2ML IJ SOLN
4.0000 mg | Freq: Once | INTRAMUSCULAR | Status: AC | PRN
  Administered 2018-04-17: 4 mg via INTRAVENOUS

## 2018-04-17 MED ORDER — ONDANSETRON HCL 4 MG/2ML IJ SOLN
INTRAMUSCULAR | Status: AC
  Administered 2018-04-17: 4 mg via INTRAVENOUS
  Filled 2018-04-17: qty 2

## 2018-04-17 MED ORDER — PHENYLEPHRINE 40 MCG/ML (10ML) SYRINGE FOR IV PUSH (FOR BLOOD PRESSURE SUPPORT)
PREFILLED_SYRINGE | INTRAVENOUS | Status: AC
  Filled 2018-04-17: qty 10

## 2018-04-17 MED ORDER — SUGAMMADEX SODIUM 200 MG/2ML IV SOLN
INTRAVENOUS | Status: DC | PRN
  Administered 2018-04-17: 200 mg via INTRAVENOUS

## 2018-04-17 MED ORDER — CEFAZOLIN SODIUM 1 G IJ SOLR
INTRAMUSCULAR | Status: AC
  Filled 2018-04-17: qty 20

## 2018-04-17 MED ORDER — FENTANYL CITRATE (PF) 250 MCG/5ML IJ SOLN
INTRAMUSCULAR | Status: DC | PRN
  Administered 2018-04-17: 100 ug via INTRAVENOUS
  Administered 2018-04-17: 50 ug via INTRAVENOUS
  Administered 2018-04-17: 100 ug via INTRAVENOUS
  Administered 2018-04-17 (×5): 50 ug via INTRAVENOUS

## 2018-04-17 MED ORDER — ONDANSETRON HCL 4 MG/2ML IJ SOLN
INTRAMUSCULAR | Status: DC | PRN
  Administered 2018-04-17 (×2): 4 mg via INTRAVENOUS

## 2018-04-17 MED ORDER — DEXAMETHASONE SODIUM PHOSPHATE 10 MG/ML IJ SOLN
INTRAMUSCULAR | Status: DC | PRN
  Administered 2018-04-17: 10 mg via INTRAVENOUS

## 2018-04-17 MED ORDER — PROPOFOL 1000 MG/100ML IV EMUL
INTRAVENOUS | Status: AC
  Filled 2018-04-17: qty 100

## 2018-04-17 MED ORDER — PHENYLEPHRINE 40 MCG/ML (10ML) SYRINGE FOR IV PUSH (FOR BLOOD PRESSURE SUPPORT)
PREFILLED_SYRINGE | INTRAVENOUS | Status: DC | PRN
  Administered 2018-04-17: 40 ug via INTRAVENOUS
  Administered 2018-04-17 (×2): 80 ug via INTRAVENOUS

## 2018-04-17 MED ORDER — SUCCINYLCHOLINE CHLORIDE 200 MG/10ML IV SOSY
PREFILLED_SYRINGE | INTRAVENOUS | Status: AC
  Filled 2018-04-17: qty 10

## 2018-04-17 MED ORDER — LIDOCAINE-EPINEPHRINE (PF) 1.5 %-1:200000 IJ SOLN
INTRAMUSCULAR | Status: DC | PRN
  Administered 2018-04-17: 30 mL via PERINEURAL

## 2018-04-17 MED ORDER — ONDANSETRON HCL 4 MG/2ML IJ SOLN
4.0000 mg | Freq: Once | INTRAMUSCULAR | Status: AC
  Administered 2018-04-17: 4 mg via INTRAVENOUS

## 2018-04-17 MED ORDER — SUCCINYLCHOLINE CHLORIDE 200 MG/10ML IV SOSY
PREFILLED_SYRINGE | INTRAVENOUS | Status: DC | PRN
  Administered 2018-04-17: 140 mg via INTRAVENOUS

## 2018-04-17 MED ORDER — HYDROMORPHONE HCL 1 MG/ML IJ SOLN
0.2500 mg | INTRAMUSCULAR | Status: DC | PRN
  Administered 2018-04-17 (×2): 0.5 mg via INTRAVENOUS

## 2018-04-17 MED ORDER — LACTATED RINGERS IV SOLN
INTRAVENOUS | Status: DC | PRN
  Administered 2018-04-17 (×3): via INTRAVENOUS

## 2018-04-17 MED ORDER — MEPERIDINE HCL 50 MG/ML IJ SOLN: 6.2500 mg | INTRAMUSCULAR | Status: DC | PRN

## 2018-04-17 MED ORDER — PROMETHAZINE HCL 25 MG/ML IJ SOLN
INTRAMUSCULAR | Status: AC
  Administered 2018-04-17: 12.5 mg via INTRAVENOUS
  Filled 2018-04-17: qty 1

## 2018-04-17 MED ORDER — PROPOFOL 10 MG/ML IV BOLUS
INTRAVENOUS | Status: DC | PRN
  Administered 2018-04-17: 100 mg via INTRAVENOUS
  Administered 2018-04-17: 30 mg via INTRAVENOUS

## 2018-04-17 MED ORDER — LIDOCAINE 2% (20 MG/ML) 5 ML SYRINGE
INTRAMUSCULAR | Status: DC | PRN
  Administered 2018-04-17: 100 mg via INTRAVENOUS

## 2018-04-17 MED ORDER — MIDAZOLAM HCL 5 MG/5ML IJ SOLN
INTRAMUSCULAR | Status: DC | PRN
  Administered 2018-04-17: 2 mg via INTRAVENOUS

## 2018-04-17 MED ORDER — MIDAZOLAM HCL 2 MG/2ML IJ SOLN
INTRAMUSCULAR | Status: AC
  Filled 2018-04-17: qty 2

## 2018-04-17 MED ORDER — CHLORHEXIDINE GLUCONATE 4 % EX LIQD: 60.0000 mL | Freq: Once | CUTANEOUS | Status: DC

## 2018-04-17 MED ORDER — CEFAZOLIN SODIUM-DEXTROSE 2-4 GM/100ML-% IV SOLN
2.0000 g | INTRAVENOUS | Status: AC
  Administered 2018-04-17 (×2): 2 g via INTRAVENOUS
  Filled 2018-04-17: qty 100

## 2018-04-17 MED ORDER — BUPIVACAINE-EPINEPHRINE (PF) 0.5% -1:200000 IJ SOLN
INTRAMUSCULAR | Status: DC | PRN
  Administered 2018-04-17: 30 mL via PERINEURAL

## 2018-04-17 MED ORDER — DEXAMETHASONE SODIUM PHOSPHATE 10 MG/ML IJ SOLN
INTRAMUSCULAR | Status: AC
  Filled 2018-04-17: qty 1

## 2018-04-17 MED ORDER — ROCURONIUM BROMIDE 50 MG/5ML IV SOSY
PREFILLED_SYRINGE | INTRAVENOUS | Status: DC | PRN
  Administered 2018-04-17: 50 mg via INTRAVENOUS

## 2018-04-17 MED ORDER — OXYCODONE HCL 5 MG PO TABS: 5.0000 mg | ORAL_TABLET | ORAL | 0 refills | Status: DC | PRN

## 2018-04-17 MED ORDER — PROPOFOL 10 MG/ML IV BOLUS
INTRAVENOUS | Status: AC
  Filled 2018-04-17: qty 20

## 2018-04-17 MED ORDER — LIDOCAINE 2% (20 MG/ML) 5 ML SYRINGE
INTRAMUSCULAR | Status: AC
  Filled 2018-04-17: qty 5

## 2018-04-17 SURGICAL SUPPLY — 69 items
ALCOHOL 70% 16 OZ (MISCELLANEOUS) ×2 IMPLANT
ANCH SUT 0 2DMD BSUT TK 14.5X3 (Anchor) ×1 IMPLANT
ANCHOR BIO-SUTURETAK 0 FWIRE (Anchor) ×1 IMPLANT
BANDAGE ESMARK 6X9 LF (GAUZE/BANDAGES/DRESSINGS) ×1 IMPLANT
BIT DRILL CANN 2.7X625 NONSTRL (BIT) ×1 IMPLANT
BIT DRILL LCP QC 2X140 (BIT) ×1 IMPLANT
BLADE SURG 15 STRL LF DISP TIS (BLADE) ×1 IMPLANT
BLADE SURG 15 STRL SS (BLADE) ×2
BNDG CMPR 9X6 STRL LF SNTH (GAUZE/BANDAGES/DRESSINGS) ×1
BNDG CMPR MED 10X6 ELC LF (GAUZE/BANDAGES/DRESSINGS) ×1
BNDG COHESIVE 4X5 TAN STRL (GAUZE/BANDAGES/DRESSINGS) ×2 IMPLANT
BNDG COHESIVE 6X5 TAN STRL LF (GAUZE/BANDAGES/DRESSINGS) ×2 IMPLANT
BNDG ELASTIC 6X10 VLCR STRL LF (GAUZE/BANDAGES/DRESSINGS) ×1 IMPLANT
BNDG ESMARK 6X9 LF (GAUZE/BANDAGES/DRESSINGS) ×2
CANISTER SUCT 3000ML PPV (MISCELLANEOUS) ×2 IMPLANT
CHLORAPREP W/TINT 26ML (MISCELLANEOUS) ×4 IMPLANT
COVER SURGICAL LIGHT HANDLE (MISCELLANEOUS) ×2 IMPLANT
COVER WAND RF STERILE (DRAPES) ×2 IMPLANT
CUFF TOURNIQUET SINGLE 34IN LL (TOURNIQUET CUFF) ×2 IMPLANT
CUFF TOURNIQUET SINGLE 44IN (TOURNIQUET CUFF) IMPLANT
DRAPE OEC MINIVIEW 54X84 (DRAPES) ×2 IMPLANT
DRAPE U-SHAPE 47X51 STRL (DRAPES) ×2 IMPLANT
DRSG MEPITEL 4X7.2 (GAUZE/BANDAGES/DRESSINGS) ×2 IMPLANT
DRSG PAD ABDOMINAL 8X10 ST (GAUZE/BANDAGES/DRESSINGS) ×4 IMPLANT
DRSG XEROFORM 1X8 (GAUZE/BANDAGES/DRESSINGS) ×1 IMPLANT
ELECT REM PT RETURN 9FT ADLT (ELECTROSURGICAL) ×2
ELECTRODE REM PT RTRN 9FT ADLT (ELECTROSURGICAL) ×1 IMPLANT
FIXATION ZIPTIGHT ANKLE SNDSMS (Ankle) IMPLANT
GAUZE SPONGE 4X4 12PLY STRL (GAUZE/BANDAGES/DRESSINGS) ×1 IMPLANT
GLOVE BIOGEL M STRL SZ7.5 (GLOVE) ×2 IMPLANT
GLOVE BIOGEL PI IND STRL 8 (GLOVE) ×1 IMPLANT
GLOVE BIOGEL PI INDICATOR 8 (GLOVE) ×1
GLOVE ECLIPSE 8.0 STRL XLNG CF (GLOVE) ×2 IMPLANT
GOWN STRL REUS W/ TWL LRG LVL3 (GOWN DISPOSABLE) ×1 IMPLANT
GOWN STRL REUS W/ TWL XL LVL3 (GOWN DISPOSABLE) ×2 IMPLANT
GOWN STRL REUS W/TWL LRG LVL3 (GOWN DISPOSABLE) ×2
GOWN STRL REUS W/TWL XL LVL3 (GOWN DISPOSABLE) ×4
K-WIRE 1.25 TRCR POINT 150 (WIRE) ×2
KIT BASIN OR (CUSTOM PROCEDURE TRAY) ×2 IMPLANT
KIT COMPRESSION 30X15X7 4LEG (Orthopedic Implant) ×1 IMPLANT
KIT COMPRESSION DRILL TEMPLATE (KITS) ×1 IMPLANT
KIT DRILL ELITE COMPRESS 3MM (DRILL) ×1 IMPLANT
KIT SUTURETAK 2.4 DRILL BIT (KITS) ×1 IMPLANT
KIT TURNOVER KIT B (KITS) ×2 IMPLANT
KWIRE 1.25 TRCR POINT 150 (WIRE) IMPLANT
NS IRRIG 1000ML POUR BTL (IV SOLUTION) ×2 IMPLANT
PACK ORTHO EXTREMITY (CUSTOM PROCEDURE TRAY) ×2 IMPLANT
PAD ARMBOARD 7.5X6 YLW CONV (MISCELLANEOUS) ×4 IMPLANT
PAD CAST 4YDX4 CTTN HI CHSV (CAST SUPPLIES) ×1 IMPLANT
PADDING CAST COTTON 4X4 STRL (CAST SUPPLIES) ×2
PUTTY DBX 1CC (Putty) ×2 IMPLANT
PUTTY DBX 1CC DEPUY (Putty) IMPLANT
SCREW CANN S THRD/26 4.0 (Screw) ×1 IMPLANT
SCREW CANN S THRD/30 4.0 (Screw) ×1 IMPLANT
SCREW CANN S THRD/36 4.0 (Screw) ×1 IMPLANT
SCREW SHORT THREAD 4.0X40 (Screw) ×1 IMPLANT
SPONGE LAP 18X18 X RAY DECT (DISPOSABLE) ×2 IMPLANT
STRAP ANKLE FOOT DISTRACTOR (ORTHOPEDIC SUPPLIES) ×1 IMPLANT
SUCTION FRAZIER HANDLE 10FR (MISCELLANEOUS) ×1
SUCTION TUBE FRAZIER 10FR DISP (MISCELLANEOUS) ×1 IMPLANT
SUT ETHILON 3 0 PS 1 (SUTURE) ×4 IMPLANT
SUT MNCRL AB 3-0 PS2 18 (SUTURE) ×1 IMPLANT
SUT PDS AB 2-0 CT1 27 (SUTURE) ×1 IMPLANT
SUT VIC AB 2-0 CT1 27 (SUTURE) ×4
SUT VIC AB 2-0 CT1 TAPERPNT 27 (SUTURE) ×2 IMPLANT
TOWEL OR 17X24 6PK STRL BLUE (TOWEL DISPOSABLE) ×2 IMPLANT
TOWEL OR 17X26 10 PK STRL BLUE (TOWEL DISPOSABLE) ×2 IMPLANT
TUBE CONNECTING 12X1/4 (SUCTIONS) ×2 IMPLANT
ZIPTIGHT ANKLE SYNODESMOSS FIX (Ankle) ×2 IMPLANT

## 2018-04-17 NOTE — Anesthesia Preprocedure Evaluation (Addendum)
Anesthesia Evaluation  Patient identified by MRN, date of birth, ID band Patient awake    Reviewed: Allergy & Precautions, NPO status , Patient's Chart, lab work & pertinent test results  Airway Mallampati: I  TM Distance: >3 FB Neck ROM: Full    Dental  (+) Dental Advisory Given, Teeth Intact   Pulmonary    Pulmonary exam normal        Cardiovascular Normal cardiovascular exam     Neuro/Psych    GI/Hepatic   Endo/Other    Renal/GU      Musculoskeletal   Abdominal   Peds  Hematology   Anesthesia Other Findings   Reproductive/Obstetrics                            Anesthesia Physical Anesthesia Plan  ASA: III  Anesthesia Plan: Regional and General   Post-op Pain Management:  Regional for Post-op pain   Induction:   PONV Risk Score and Plan: 2 and Ondansetron, Treatment may vary due to age or medical condition and Midazolam  Airway Management Planned: LMA  Additional Equipment:   Intra-op Plan:   Post-operative Plan: Extubation in OR  Informed Consent: I have reviewed the patients History and Physical, chart, labs and discussed the procedure including the risks, benefits and alternatives for the proposed anesthesia with the patient or authorized representative who has indicated his/her understanding and acceptance.     Plan Discussed with: CRNA and Surgeon  Anesthesia Plan Comments: (Pt nauseated on OR table -> GA with RSI)      Anesthesia Quick Evaluation

## 2018-04-17 NOTE — Transfer of Care (Signed)
Immediate Anesthesia Transfer of Care Note  Patient: Virginia May  Procedure(s) Performed: 1ST AND 2ND TARSOMETATARSAL ARTHRODESIS -W- POSSIBLE 3RD TARSOMETATARSAL FUSION AND ANKLE ARTHROSCOPY DEBRIDEMENT -W- POSSIBLE LATERAL LIGAMENT RECONSTRUCTION AND HARDWARE REMOVAL (Right Leg Lower)  Patient Location: PACU  Anesthesia Type:General and Regional  Level of Consciousness: awake, alert  and oriented  Airway & Oxygen Therapy: Patient Spontanous Breathing  Post-op Assessment: Report given to RN, Post -op Vital signs reviewed and stable and Patient moving all extremities X 4  Post vital signs: Reviewed and stable  Last Vitals:  Vitals Value Taken Time  BP 143/73 04/17/2018 12:41 PM  Temp    Pulse 106 04/17/2018 12:44 PM  Resp 24 04/17/2018 12:44 PM  SpO2 98 % 04/17/2018 12:44 PM  Vitals shown include unvalidated device data.  Last Pain:  Vitals:   04/17/18 0627  PainSc: 4          Complications: No apparent anesthesia complications

## 2018-04-17 NOTE — Discharge Instructions (Signed)
DR. Tailor Lucking FOOT [facility] ANKLE SURGERY POST-OP INSTRUCTIONS ° ° °Pain Management °1. The numbing medicine and your leg will last around 8 hours, take a dose of your pain medicine as soon as you feel it wearing off to avoid rebound pain. °2. Keep your foot elevated above heart level.  Make sure that your heel hangs free ('floats'). °3. Take all prescribed medication as directed. °4. If taking narcotic pain medication you may want to use an over-the-counter stool softener to avoid constipation. °5. You may take over-the-counter NSAIDs (ibuprofen, naproxen, etc.) as well as over-the-counter acetaminophen as directed on the packaging as a supplement for your pain and may also use it to wean away from the prescription medication. ° °Activity °? Non-weightbearing °? Postoperatively, you will be placed into a splint which stays on for 2 weeks and then will be changed at your first postop visit. ° °First Postoperative Visit °1. Your first postop visit will be at least 2 weeks after surgery.  This should be scheduled when you schedule surgery. °2. If you do not have a postoperative visit scheduled please call [phone] to schedule an appointment. °3. At the appointment your incision will be evaluated for suture removal, x-rays will be obtained if necessary. ° °General Instructions °1. Swelling is very common after foot and ankle surgery.  It often takes 3 months for the foot and ankle to begin to feel comfortable.  Some amount of swelling will persist for 6-12 months. °2. DO NOT change the dressing.  If there is a problem with the dressing (too tight, loose, gets wet, etc.) please contact Dr. Atziri Zubiate's office. °3. DO NOT get the dressing wet.  For showers you can use an over-the-counter cast cover or wrap a washcloth around the top of your dressing and then cover it with a plastic bag and tape it to your leg. °4. DO NOT soak the incision (no tubs, pools, bath, etc.) until you have approval from Dr. Bradleigh Sonnen. ° °Contact Dr. Adairs  office or go to Emergency Room if: °1. Temperature above 101° F. °2. Increasing pain that is unresponsive to pain medication or elevation °3. Excessive redness or swelling in your foot °4. Dressing problems - excessive bloody drainage, looseness or tightness, or if dressing gets wet °5. Develop pain, swelling, warmth, or discoloration of your calf ° °

## 2018-04-17 NOTE — Op Note (Signed)
Virginia May female 33 y.o. 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 Arthroscopic treatment of talar dome osteochondral lesion Open treatment of first metatarsal base malunion Right first TMT arthrodesis Open treatment of Lisfranc fracture dislocation malunion Lisfranc arthrodesis Second TMT arthrodesis Lateral ligament reconstruction Open treatment of syndesmosis Removal of hardware deep  Surgeon: Terance Hart   Assistants: Jill Alexanders, RNFA  Anesthesia: General endotracheal anesthesia  Findings: Significant ankle scarring and synovitis within the syndesmosis and anterolateral ankle. Kissing osteochondral lesion on the medial tibial plafond and medial talar dome Lateral ligament incompetence Syndesmotic disruption First TMT arthritis Second TMT subluxation with malunited first metatarsal base fracture and Lisfranc ligament disruption Second TMT osteoarthritis   Implants:  Synthes 4.0 mm cannulated screws Synthes nitinol staple Arthrex suture tack [employer] zip loop  Indications:32 y.o. female sustained a tibia fracture 10 years ago.  She underwent intramedullary nailing.  She healed her fracture well but since then has had pain and swelling of her right ankle.  She has notable instability and rolled her  ankle often.  She was seen in the clinic and underwent a round of boot immobilization, ASO use, physical therapy and other nonoperative treatment modalities with failed these.  She continues have pain and swelling in her ankle.  Pain was located mostly anterolaterally and more proximally in the anterolateral ankle near the level of the syndesmosis.  There was concern for symptomatic orthopedic hardware given the distal interlocking screws.  Given MRI and CT scan findings there is concern for a previously missed Lisfranc injury with malunited first metatarsal base and continued second TMT subluxation and widening of the syndesmosis.  She had developed some arthritis in the first TMT and second TMT joints as well.  Given this she was indicated for surgery.  The risk benefits alternatives to the procedure were discussed.  Risks include but are not limited to wound healing complications, infection, malunion, nonunion, need for second surgery, demonstrating structures, continued instability of her ankle, continued pain, development of ankle arthritis.  After weighing these risks she wished to proceed.  We also discussed briefly the perioperative and anesthetic risk which included death.  Procedure Detail: The patient was identified in the preoperative holding area.  The right leg was marked by myself.  The consent was signed by myself and the patient.  A peripheral nerve block was performed by anesthesia.  She was taken to the operative suite and placed supine on the operative table.  General anesthesia was induced without difficulty.  A thigh tourniquet was placed on the right thigh.  A bump was placed on the right hip.  All bony prominences were well-padded.  The right lower extremity was prepped and draped in the usual sterile fashion.  A surgical timeout was performed.  The leg was elevated and the thigh tourniquet was inflated to 250 mmHg.  The ankle distractor was placed and confirmed to be well-padded.  I  made an anteromedial portal to the ankle that was medial to the tibialis anterior tendon.  This was taken  sharply down through skin and dermal tissue.  Then using a hemostat blunt dissection was performed in the medial ankle capsule was entered.  Then the camera and trocar was entered and the fluid was started.  Upon entering the ankle joint there was significant amount of scarring in the lateral ankle.  There was synovitis and evidence of prior injury.  Upon inspection of the cartilaginous structures there did not seem to be any damage to the lateral talar dome lateral tibial plafond or distal fibula.  There was evidence of synovitis within the syndesmosis.  Then I made an anterolateral portal with blunt dissection to ensure no injury to the superficial peroneal nerve.  Then the shaver was entered and extensive debridement of the anterolateral aspect of the ankle at the site of the injury and scarring was performed.  This was taken back to clear out this area to avoid any further impingement.  Then the remaining cartilaginous structures were inspected.  There was found to be a kissing lesion osteochondral type lesion of the medial talar dome and distal medial tibial plafond.  The camera was placed into the lateral portal and using a curette through the medial portal I was able to debride back the damaged cartilage from the tibial plafond side without needing to expose underlying subchondral bone.  There was loose cartilage on the medial talar dome that I curetted back to a stable base.  The underlying subchondral bone was curetted back to healthy cancellus subchondral bone.  The water was turned off to allow for bleeding into this treated osteochondral lesion.  Then the medial ankle joint was debrided extensively of scarring and synovitic tissue.  This completed the extensive ankle arthroscopic debridement and arthroscopic treatment of the osteochondral lesion of her talus.  We then turned our attention to her  foot.  There was a large bony prominence over the first TMT and second TMT joints.  A dorsal incision was made overlying the second TMT centered along the second metatarsal.  This taken sharply down through skin and subcutaneous tissue.  Then blunt dissection was used to identify the extensor hallucis brevis tendon.  This was mobilized to allow for lateral and medial retraction.  Then the neurovascular bundle was identified and mobilized as well.  I then continued sharply down through the periosteal tissue exposing the second TMT joint.  There is significant amount of arthritis and bone spurring there.  The bone spurs were rongeured off.  I was then able to inspect the joint and there was significant out of cartilage damage.  It was also noted that there was lateral subluxation of the second TMT joint with widening of the Lisfranc joint.  I was unable to better evaluate the Lisfranc joint and there was scarring and bony fragmentation within this joint.  This was removed with a rondure.  I then exposed the first TMT joint subperiosteally while protecting the neurovascular bundle laterally.  Then this joint was inspected and there was significant amount of cartilage damage and evidence of prior first metatarsal base malunion with lateralization of the plantar fragment of the first metatarsal base.  This joint was then mobilized and the cartilage was removed using a curette and rondure.  I then remove the cartilage from the second TMT joint as well as within the Lisfranc joint area between the bases of the first and second metatarsal.  The intercuneiform joints were not violated.  I then irrigated out the prep joints to ensure all cartilage structures were removed.  I then trephinated the first TMT joint, Lisfranc joint, second TMT joint with a 2.7 mm drill.  Then the first TMT joint was reduced and provisionally fixed with a K wire.  Then I placed 2 4-3mm partially-threaded cancellus screws across the joint for  fixation.  Then I used a Weber clamp to reduce the Lisfranc joint and reestablish the second TMT joint.  As you recall this was prior subluxated and this reestablished a near anatomic reduction.  I then placed a single 4 oh cannulated screw from dorsal lateral to plantar medial across the Lisfranc joint.  Then placed a second screw from the base of the second TMT across to the base of the first TMT for added stabilization of this joint.  Then the second TMT joint was reduced and filled with autograft and DBX bone graft.  Then using Synthes 4 prong Y staple the second TMT joint was fixed.  I confirmed appropriate screw length and placement on fluoroscopy during the case as well as adequate reduction of the joints.  The tourniquet was released during this part of the case as it had reached 2 hours.  We left the tourniquet down for a total of 25 minutes and then reinflated it for the lateral ligament reconstruction portion of the case.  Then turned my attention to the lateral ligament reconstruction.  A incision was made on the distal aspect of the fibula taken down sharply through skin and subcutaneous tissue.  Then skin flaps were created and is able to identify the distal fibula anteriorly.  Upon further inspection it was noted that the lateral ligament complex was completely attenuated and incompetent.  I mobilize this tissue and inspected the lateral ankle joint.  There was some tissue within the lateral gutter which was debrided back.  Upon doing this I noted that there was instability of the syndesmosis.  I was able to place a Freer within this incisura and mobilized the fibula several millimeters.  The syndesmotic ligaments were incompetent.  Because of this a Network engineer zip loop was used to reconstruct the syndesmotic ligaments completing the open treatment of the syndesmosis.  Prior to zip loop placement a Weber clamp was used to hold the syndesmotic reduced under direct visualization.  Then using a  Arthrex suture tack in the distal fibula the lateral ligaments were reconstructed.  The tissue was reefed to the distal fibula that was prepped back to bleeding cancellus bone to allow for good scarring.  After this the was much more stable.  After this all the wounds were irrigated with normal saline.  The deep tissue was closed with 2-0 PDS the subcu tenuous tissue was closed with 3-0 Monocryl and the skin with 3-0 nylon.  A soft dressing was placed over Xeroform, 4 x 4's and sterile she cotton.  She was placed in a short leg splint and hindfoot eversion position.  The counts were correct at the end of the case there were no complications.  After this she was awakened from anesthesia and taken to recovery room in stable condition.  Post Op Instructions: Nonweightbearing to right lower extremity. Keep splint dry Follow-up in 2 weeks for suture removal, x-rays and wound check. Call the office with concerns   Tourniquette Time:2 hours, 1 Hour 7 minutes  Estimated Blood Loss:  less than 50 mL         Drains: none  Blood Given: none         Specimens: none  Complications:  * No complications entered in OR log *         Disposition: PACU - hemodynamically stable.         Condition: stable

## 2018-04-17 NOTE — Anesthesia Procedure Notes (Signed)
Procedure Name: Intubation Date/Time: 04/17/2018 7:41 AM Performed by: Nils Pyle, CRNA Pre-anesthesia Checklist: Patient identified, Emergency Drugs available, Suction available and Patient being monitored Patient Re-evaluated:Patient Re-evaluated prior to induction Oxygen Delivery Method: Circle System Utilized Preoxygenation: Pre-oxygenation with 100% oxygen Induction Type: IV induction, Rapid sequence and Cricoid Pressure applied Laryngoscope Size: Miller and 2 Grade View: Grade I Tube type: Oral Tube size: 7.0 mm Number of attempts: 1 Airway Equipment and Method: Stylet and Oral airway Placement Confirmation: ETT inserted through vocal cords under direct vision,  positive ETCO2 and breath sounds checked- equal and bilateral Secured at: 22 cm Tube secured with: Tape Dental Injury: Teeth and Oropharynx as per pre-operative assessment

## 2018-04-17 NOTE — H&P (Signed)
Virginia May is an 33 y.o. female.   Chief Complaint: Right ankle and foot pain HPI: Patient sustained a tibia fracture and underwent IM nailing previously.  Since that time she has developed pain and swelling in her dorsal lateral foot and developed pain in her ankle and anterolateral ankle.  She was seen in my office and diagnosed with midfoot arthritis and likely missed Lisfranc injury previously.  There is widening of the Lisfranc joint and some deformity and subluxation of the second TMT joint.  She is noted some deviation of her second toe in a valgus position.  She has no forefoot pain.  Her pain affects her activities of daily living and she has failed conservative treatment in the form of boot immobilization, activity modification, anti-inflammatories, physical therapy and is indicated for surgery.  She is here today for surgical fixation.  Past Medical History:  Diagnosis Date  . Asthma, moderate persistent 10/28/2014  . Obesity 10/28/2014  . Tibia/fibula fracture r    Past Surgical History:  Procedure Laterality Date  . IM NAILING TIBIA Right     Family History  Problem Relation Age of Onset  . Diabetes Mother   . Hypertension Mother   . Diabetes Father   . Hypertension Father   . Stroke Maternal Grandmother   . Stroke Maternal Grandfather    Social History:  reports that she has never smoked. She has never used smokeless tobacco. She reports that she does not drink alcohol or use drugs.  Allergies:  Allergies  Allergen Reactions  . Ibuprofen Shortness Of Breath    Medications Prior to Admission  Medication Sig Dispense Refill  . acetaminophen (TYLENOL) 650 MG CR tablet Take 2 tablets (1,300 mg total) by mouth 2 (two) times daily. 90 tablet 3  . fluticasone furoate-vilanterol (BREO ELLIPTA) 100-25 MCG/INH AEPB Inhale 1 puff into the lungs daily. 1 each 11  . loratadine (CLARITIN) 10 MG tablet Take 10 mg by mouth daily.     . montelukast (SINGULAIR) 10 MG tablet Take 1  tablet (10 mg total) by mouth at bedtime. 90 tablet 3  . phentermine (ADIPEX-P) 37.5 MG tablet One-half tab by mouth qAM (Patient taking differently: Take 18.75 mg by mouth daily before breakfast. One-half tab by mouth qAM) 45 tablet 0  . topiramate (TOPAMAX) 50 MG tablet Take 1 tablet (50 mg total) by mouth 2 (two) times daily. 60 tablet 0  . albuterol (PROVENTIL HFA;VENTOLIN HFA) 108 (90 Base) MCG/ACT inhaler Inhale 2 puffs into the lungs every 6 (six) hours as needed for wheezing. 2 Inhaler 11  . diclofenac sodium (VOLTAREN) 1 % GEL Apply 4 g topically 4 (four) times daily. To affected joint. (Patient not taking: Reported on 04/03/2018) 100 g 11  . EPINEPHrine (EPIPEN 2-PAK) 0.3 mg/0.3 mL IJ SOAJ injection Inject 0.3 mg into the muscle once.       Results for orders placed or performed during the hospital encounter of 04/17/18 (from the past 48 hour(s))  Pregnancy, urine POC     Status: None   Collection Time: 04/17/18  6:41 AM  Result Value Ref Range   Preg Test, Ur NEGATIVE NEGATIVE    Comment:        THE SENSITIVITY OF THIS METHODOLOGY IS >24 mIU/mL    No results found.  Review of Systems  Constitutional: Negative.   HENT: Negative.   Eyes: Negative.   Respiratory: Negative.   Cardiovascular: Negative.   Gastrointestinal: Negative.   Musculoskeletal:  Right ankle pain, right foot pain.  Skin: Negative.   Neurological: Negative.   Psychiatric/Behavioral: Negative.     Blood pressure 108/66, pulse 96, temperature 98.6 F (37 C), SpO2 97 %. Physical Exam  Constitutional: She appears well-developed.  HENT:  Head: Normocephalic.  Eyes: Conjunctivae are normal.  Neck: Neck supple.  Cardiovascular: Normal rate.  Respiratory: Effort normal.  GI: Soft.  Musculoskeletal:     Comments: Evaluation of the right ankle demonstrates some instability with anterior drawer testing.  She has tenderness palpation anterolateral ankle near the level of the syndesmosis as well.  Some  tenderness palpation the anterior ankle.  She does have some dorsal prominence about the dorsal medial foot.  This area is tender to palpation over the first and second TMT joints.  She has some valgus deformity of her second toe distally.  She is able to actively dorsiflex plantarflex her ankle.  Sensation intact dorsal plantar surface of the foot.  Foot is warm and well-perfused.  Neurological: She is alert.  Skin: Skin is warm.  Psychiatric: She has a normal mood and affect.     Assessment/Plan We will proceed with planned surgery.  Surgery will be in the form of arthroscopic ankle debridement with lateral ligament reconstruction and removal of the distal interlocking screw.  We will then address her midfoot with open treatment of her Lisfranc malunion and perform a first and second TMT arthrodesis with possible third TMT arthrodesis.  We will also reduce and fuse the Lisfranc joint.  She understands the risk, benefits and alternatives of surgery which were discussed with her and her family.  The risk included but were not limited to wound healing complications, infection, nonunion, malunion, continued pain, need for second surgery, damage to surrounding structures.  After weighing these risks and benefits she wished to proceed with surgery.  We also briefly discussed the perioperative and anesthetic risks which include death.  Terance Hart, MD 04/17/2018, 7:13 AM

## 2018-04-17 NOTE — Anesthesia Postprocedure Evaluation (Signed)
Anesthesia Post Note  Patient: Virginia May  Procedure(s) Performed: 1ST AND 2ND TARSOMETATARSAL ARTHRODESIS -W- POSSIBLE 3RD TARSOMETATARSAL FUSION AND ANKLE ARTHROSCOPY DEBRIDEMENT -W- POSSIBLE LATERAL LIGAMENT RECONSTRUCTION AND HARDWARE REMOVAL (Right Leg Lower)     Patient location during evaluation: PACU Anesthesia Type: General Level of consciousness: awake and alert Pain management: pain level controlled Vital Signs Assessment: post-procedure vital signs reviewed and stable Respiratory status: spontaneous breathing, nonlabored ventilation, respiratory function stable and patient connected to nasal cannula oxygen Cardiovascular status: blood pressure returned to baseline and stable Postop Assessment: no apparent nausea or vomiting Anesthetic complications: no    Last Vitals:  Vitals:   04/17/18 1341 04/17/18 1350  BP: 126/75 133/75  Pulse: 94 93  Resp: 19 18  Temp:  (!) 36.1 C  SpO2: 99% 100%    Last Pain:  Vitals:   04/17/18 1341  PainSc: 4                  Mathhew Buysse DAVID

## 2018-04-17 NOTE — Anesthesia Procedure Notes (Signed)
Anesthesia Regional Block: Popliteal block   Pre-Anesthetic Checklist: ,, timeout performed, Correct Patient, Correct Site, Correct Laterality, Correct Procedure, Correct Position, site marked, Risks and benefits discussed,  Surgical consent,  Pre-op evaluation,  At surgeon's request and post-op pain management  Laterality: Right  Prep: chloraprep       Needles:  Injection technique: Single-shot  Needle Type: Echogenic Stimulator Needle     Needle Length: 10cm  Needle Gauge: 21     Additional Needles:   Procedures:, nerve stimulator,,,,,,,   Nerve Stimulator or Paresthesia:  Response: 0.4 mA,   Additional Responses:   Narrative:  Start time: 04/17/2018 7:15 AM End time: 04/17/2018 7:25 AM Injection made incrementally with aspirations every 5 mL.  Performed by: Personally  Anesthesiologist: Arta Brucessey, Quinetta Shilling, MD  Additional Notes: Monitors applied. Patient sedated. Sterile prep and drape,hand hygiene and sterile gloves were used. Relevant anatomy identified.Needle position confirmed.Local anesthetic injected incrementally after negative aspiration. Local anesthetic spread visualized around nerve(s). Vascular puncture avoided. No complications. Image printed for medical record.The patient tolerated the procedure well.  Additional Saphenous nerve block performed. 15cc Local Anesthetic mixture placed under ultrasonic guidance along the medio-inferior border of the Sartorious muscle 6 inches above the knee.  No Problems encountered.  Arta BruceKevin Rilan Eiland MD

## 2018-04-17 NOTE — Progress Notes (Signed)
Patient pivoted to wheelchair. IV was removed, then patient had one episode of emesis. Dr. Michelle Piper made aware. Received orders for PR phenergan. Patient refused. Dr. Michelle Piper states patient can be discharged home.

## 2018-04-22 ENCOUNTER — Encounter (HOSPITAL_COMMUNITY): Payer: Self-pay | Admitting: Orthopaedic Surgery

## 2018-05-02 DIAGNOSIS — M19071 Primary osteoarthritis, right ankle and foot: Secondary | ICD-10-CM | POA: Diagnosis not present

## 2018-05-12 ENCOUNTER — Ambulatory Visit: Payer: BLUE CROSS/BLUE SHIELD | Admitting: Sports Medicine

## 2018-05-15 DIAGNOSIS — M19071 Primary osteoarthritis, right ankle and foot: Secondary | ICD-10-CM | POA: Diagnosis not present

## 2018-05-28 DIAGNOSIS — M19071 Primary osteoarthritis, right ankle and foot: Secondary | ICD-10-CM | POA: Diagnosis not present

## 2018-07-16 DIAGNOSIS — M19071 Primary osteoarthritis, right ankle and foot: Secondary | ICD-10-CM | POA: Diagnosis not present

## 2018-07-18 ENCOUNTER — Encounter (INDEPENDENT_AMBULATORY_CARE_PROVIDER_SITE_OTHER): Payer: BLUE CROSS/BLUE SHIELD | Admitting: Sports Medicine

## 2018-07-18 DIAGNOSIS — J301 Allergic rhinitis due to pollen: Secondary | ICD-10-CM | POA: Diagnosis not present

## 2018-07-18 DIAGNOSIS — J4540 Moderate persistent asthma, uncomplicated: Secondary | ICD-10-CM

## 2018-07-21 DIAGNOSIS — J302 Other seasonal allergic rhinitis: Secondary | ICD-10-CM | POA: Insufficient documentation

## 2018-07-21 MED ORDER — AZELASTINE HCL 0.1 % NA SOLN: 2.0000 | Freq: Two times a day (BID) | NASAL | 1 refills | Status: DC

## 2018-07-21 MED ORDER — MONTELUKAST SODIUM 10 MG PO TABS: 10.0000 mg | ORAL_TABLET | Freq: Every day | ORAL | 3 refills | Status: DC

## 2018-07-21 NOTE — Assessment & Plan Note (Signed)
I am going to refill with a year supply of Singulair, it is probably also reasonable to add nasal azelastine, I'll call this in too.  If you have been doing Claritin daily for a long time then it may be reasonable to switch to something like Allegra.  If this does not work after a few weeks and then we also need to consider looking into acid reflux as a cause of your throat clearing.

## 2018-08-25 ENCOUNTER — Encounter: Payer: Self-pay | Admitting: Sports Medicine

## 2018-08-25 ENCOUNTER — Other Ambulatory Visit: Payer: Self-pay | Admitting: Sports Medicine

## 2018-08-25 ENCOUNTER — Ambulatory Visit (INDEPENDENT_AMBULATORY_CARE_PROVIDER_SITE_OTHER): Payer: BLUE CROSS/BLUE SHIELD | Admitting: Sports Medicine

## 2018-08-25 DIAGNOSIS — E6609 Other obesity due to excess calories: Secondary | ICD-10-CM

## 2018-08-25 DIAGNOSIS — Z0000 Encounter for general adult medical examination without abnormal findings: Secondary | ICD-10-CM | POA: Diagnosis not present

## 2018-08-25 DIAGNOSIS — J4540 Moderate persistent asthma, uncomplicated: Secondary | ICD-10-CM | POA: Diagnosis not present

## 2018-08-25 DIAGNOSIS — R7989 Other specified abnormal findings of blood chemistry: Secondary | ICD-10-CM | POA: Diagnosis not present

## 2018-08-25 MED ORDER — FLUTICASONE FUROATE-VILANTEROL 100-25 MCG/INH IN AEPB: 1.0000 | INHALATION_SPRAY | Freq: Every day | RESPIRATORY_TRACT | 11 refills | Status: DC

## 2018-08-25 MED ORDER — ALBUTEROL SULFATE HFA 108 (90 BASE) MCG/ACT IN AERS: 2.0000 | INHALATION_SPRAY | Freq: Four times a day (QID) | RESPIRATORY_TRACT | 11 refills | Status: DC | PRN

## 2018-08-25 NOTE — Assessment & Plan Note (Signed)
Annual physical as above. Checking routine labs, up-to-date on preventive measures.

## 2018-08-25 NOTE — Progress Notes (Signed)
Subjective:    CC: Annual physical exam  HPI:  Ophie is here for her physical, she is doing well, asthma is well controlled, she has continued to maintain her weight off of weight loss medications, she is fasting for labs, has no complaints.  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  I reviewed the past medical history, family history, social history, surgical history, and allergies today and no changes were needed.  Please see the problem list section below in epic for further details.  Past Medical History: Past Medical History:  Diagnosis Date  . Asthma, moderate persistent 10/28/2014  . Obesity 10/28/2014  . Tibia/fibula fracture r   Past Surgical History: Past Surgical History:  Procedure Laterality Date  . ARTHRODESIS METATARSAL Right 04/17/2018   Procedure: 1ST AND 2ND TARSOMETATARSAL ARTHRODESIS -W- POSSIBLE 3RD TARSOMETATARSAL FUSION AND ANKLE ARTHROSCOPY DEBRIDEMENT -W- POSSIBLE LATERAL LIGAMENT RECONSTRUCTION AND HARDWARE REMOVAL;  Surgeon: Terance Hart, MD;  Location: [facility] OR;  Service: Orthopedics;  Laterality: Right;  LENGTH: 210 HRS  . IM NAILING TIBIA Right    Social History: Social History   Socioeconomic History  . Marital status: Single    Spouse name: Not on file  . Number of children: Not on file  . Years of education: Not on file  . Highest education level: Not on file  Occupational History  . Not on file  Social Needs  . Financial resource strain: Not on file  . Food insecurity:    Worry: Not on file    Inability: Not on file  . Transportation needs:    Medical: Not on file    Non-medical: Not on file  Tobacco Use  . Smoking status: Never Smoker  . Smokeless tobacco: Never Used  Substance and Sexual Activity  . Alcohol use: No  . Drug use: No  . Sexual activity: Not Currently    Birth control/protection: None  Lifestyle  . Physical activity:    Days per week: Not  on file    Minutes per session: Not on file  . Stress: Not on file  Relationships  . Social connections:    Talks on phone: Not on file    Gets together: Not on file    Attends religious service: Not on file    Active member of club or organization: Not on file    Attends meetings of clubs or organizations: Not on file    Relationship status: Not on file  Other Topics Concern  . Not on file  Social History Narrative  . Not on file   Family History: Family History  Problem Relation Age of Onset  . Diabetes Mother   . Hypertension Mother   . Diabetes Father   . Hypertension Father   . Stroke Maternal Grandmother   . Stroke Maternal Grandfather    Allergies: Allergies  Allergen Reactions  . Ibuprofen Shortness Of Breath   Medications: See med rec.  Review of Systems: No headache, visual changes, nausea, vomiting, diarrhea, constipation, dizziness, abdominal pain, skin rash, fevers, chills, night sweats, swollen lymph nodes, weight loss, chest pain, body aches, joint swelling, muscle aches, shortness of breath, mood changes, visual or auditory hallucinations.  Objective:    General: Well Developed, well nourished, and in no acute distress.  Neuro: Alert and oriented x3, extra-ocular muscles intact, sensation grossly intact. Cranial nerves II through XII are intact, motor, sensory, and coordinative functions are all  intact. HEENT: Normocephalic, atraumatic, pupils equal round reactive to light, neck supple, no masses, no lymphadenopathy, thyroid nonpalpable. Oropharynx, nasopharynx, external ear canals are unremarkable. Skin: Warm and dry, no rashes noted.  Cardiac: Regular rate and rhythm, no murmurs rubs or gallops.  Respiratory: Clear to auscultation bilaterally. Not using accessory muscles, speaking in full sentences.  Abdominal: Soft, nontender, nondistended, positive bowel sounds, no masses, no organomegaly.  Musculoskeletal: Shoulder, elbow, wrist, hip, knee, ankle  stable, and with full range of motion.  Impression and Recommendations:    The patient was counselled, risk factors were discussed, anticipatory guidance given.  Annual physical exam Annual physical as above. Checking routine labs, up-to-date on preventive measures.  Obesity [name] has done a great job maintaining her weight.  Her weight loss has been approximately 60 pounds. She is maintaining on her own.  Asthma, moderate persistent Well-controlled on current regimen, no changes needed.   ___________________________________________ Ihor Austinhomas J. Benjamin Stainhekkekandam, M.D., ABFM., CAQSM. Primary Care and Sports Medicine  [facility]  Adjunct Professor of Family Medicine  [employer]

## 2018-08-25 NOTE — Assessment & Plan Note (Signed)
Well-controlled on current regimen, no changes needed.

## 2018-08-25 NOTE — Assessment & Plan Note (Signed)
Virginia May has done a great job maintaining her weight.  Her weight loss has been approximately 60 pounds. She is maintaining on her own.

## 2018-08-26 LAB — COMPLETE METABOLIC PANEL WITH GFR
AG Ratio: 2.4 (calc) (ref 1.0–2.5)
ALT: 10 U/L (ref 6–29)
AST: 15 U/L (ref 10–30)
Albumin: 4.4 g/dL (ref 3.6–5.1)
Alkaline phosphatase (APISO): 57 U/L (ref 31–125)
BUN: 11 mg/dL (ref 7–25)
CO2: 26 mmol/L (ref 20–32)
Calcium: 9.4 mg/dL (ref 8.6–10.2)
Chloride: 107 mmol/L (ref 98–110)
Creat: 0.64 mg/dL (ref 0.50–1.10)
GFR, Est African American: 138 mL/min/{1.73_m2} (ref >=60)
GFR, Est Non African American: 119 mL/min/{1.73_m2} (ref >=60)
Globulin: 1.8 g/dL (calc) — ABNORMAL LOW (ref 1.9–3.7)
Glucose, Bld: 93 mg/dL (ref 65–99)
Potassium: 4.2 mmol/L (ref 3.5–5.3)
Sodium: 140 mmol/L (ref 135–146)
Total Bilirubin: 0.3 mg/dL (ref 0.2–1.2)
Total Protein: 6.2 g/dL (ref 6.1–8.1)

## 2018-08-26 LAB — TSH: TSH: 2.35 mIU/L

## 2018-08-26 LAB — LIPID PANEL W/REFLEX DIRECT LDL
Cholesterol: 172 mg/dL (ref <200)
HDL: 58 mg/dL (ref >=50)
LDL Cholesterol (Calc): 100 mg/dL (calc) — ABNORMAL HIGH
Non-HDL Cholesterol (Calc): 114 mg/dL (calc) (ref <130)
Total CHOL/HDL Ratio: 3 (calc) (ref <5.0)
Triglycerides: 49 mg/dL (ref <150)

## 2018-08-26 LAB — CBC
HCT: 42.1 % (ref 35.0–45.0)
Hemoglobin: 14 g/dL (ref 11.7–15.5)
MCH: 26.5 pg — ABNORMAL LOW (ref 27.0–33.0)
MCHC: 33.3 g/dL (ref 32.0–36.0)
MCV: 79.7 fL — ABNORMAL LOW (ref 80.0–100.0)
MPV: 11.5 fL (ref 7.5–12.5)
Platelets: 250 10*3/uL (ref 140–400)
RBC: 5.28 10*6/uL — ABNORMAL HIGH (ref 3.80–5.10)
RDW: 14 % (ref 11.0–15.0)
WBC: 4.6 10*3/uL (ref 3.8–10.8)

## 2018-08-26 LAB — HEMOGLOBIN A1C
Hgb A1c MFr Bld: 5.2 % of total Hgb (ref <5.7)
Mean Plasma Glucose: 103 (calc)
eAG (mmol/L): 5.7 (calc)

## 2018-08-26 LAB — VITAMIN D 25 HYDROXY (VIT D DEFICIENCY, FRACTURES): Vit D, 25-Hydroxy: 19 ng/mL — ABNORMAL LOW (ref 30–100)

## 2018-08-26 MED ORDER — VITAMIN D (ERGOCALCIFEROL) 1.25 MG (50000 UNIT) PO CAPS: 50000.0000 [IU] | ORAL_CAPSULE | ORAL | 0 refills | Status: DC

## 2018-08-26 NOTE — Addendum Note (Signed)
Addended by: Monica Becton on: 08/26/2018 08:36 AM   Modules accepted: Orders

## 2018-10-15 DIAGNOSIS — M19071 Primary osteoarthritis, right ankle and foot: Secondary | ICD-10-CM | POA: Diagnosis not present

## 2018-10-16 ENCOUNTER — Other Ambulatory Visit: Payer: Self-pay | Admitting: Sports Medicine

## 2018-11-24 ENCOUNTER — Encounter: Payer: Self-pay | Admitting: Sports Medicine

## 2018-11-24 ENCOUNTER — Ambulatory Visit (INDEPENDENT_AMBULATORY_CARE_PROVIDER_SITE_OTHER)
Admission: RE | Admit: 2018-11-24 | Discharge: 2018-11-24 | Disposition: A | Discharge status: Home / Self Care | Payer: BLUE CROSS/BLUE SHIELD | Source: Ambulatory Visit

## 2018-11-24 DIAGNOSIS — J4521 Mild intermittent asthma with (acute) exacerbation: Secondary | ICD-10-CM

## 2018-11-24 DIAGNOSIS — J209 Acute bronchitis, unspecified: Secondary | ICD-10-CM | POA: Diagnosis not present

## 2018-11-24 MED ORDER — PREDNISONE 20 MG PO TABS: 20.0000 mg | ORAL_TABLET | Freq: Two times a day (BID) | ORAL | 0 refills | Status: AC

## 2018-11-24 MED ORDER — AZITHROMYCIN 250 MG PO TABS: 250.0000 mg | ORAL_TABLET | Freq: Every day | ORAL | 0 refills | Status: DC

## 2018-11-24 NOTE — ED Provider Notes (Addendum)
[place]    Virtual Visit via Video Note:  [name]  initiated request for Telemedicine visit with [facility]gent [facility] team. I connected with Ronne Binning  on 11/24/2018 at 5:38 PM  for a synchronized telemedicine visit using a video enabled HIPPA compliant telemedicine application. I verified that I am speaking with Ronne Binning  using two identifiers. Lestine Box, PA-C  was physically located in a [facility] Urgent care site and DAVANNA HE was located at a different location.   The limitations of evaluation and management by telemedicine as well as the availability of in-person appointments were discussed. Patient was informed that she  may incur a bill ( including co-pay) for this virtual visit encounter. Ronne Binning  expressed understanding and gave verbal consent to proceed with virtual visit.  [id number] 11/24/18 Arrival Time: 29  Cc: COUGH and wheezing  SUBJECTIVE:  Virginia May is a 33 y.o. female hx of asthma, who presents with nasal congestion, and productive cough with brown phlegm x few days.  Denies sick exposure to COVID, flu or strep.  Denies recent travel.  Has tried OTC medications, inhaler, and nebulizer with minimal relief.  Denies aggravating factors.  Reports previous symptoms in the past that improved with antibiotic and prednisone.   Complains of associated wheezing.  Denies fever, chills, fatigue, sinus pain, rhinorrhea, sore throat, SOB, chest pain, chest tightness, nausea, vomiting, changes in bowel or bladder habits.  Denies tobacco use.      ROS: As per HPI.  All other pertinent ROS negative.     Past Medical History:  Diagnosis Date  . Asthma, moderate persistent 10/28/2014  . Obesity 10/28/2014  . Tibia/fibula fracture r   Past Surgical History:  Procedure Laterality Date  . ARTHRODESIS METATARSAL Right 04/17/2018   Procedure: 1ST AND 2ND TARSOMETATARSAL ARTHRODESIS -W- POSSIBLE 3RD TARSOMETATARSAL FUSION AND ANKLE  ARTHROSCOPY DEBRIDEMENT -W- POSSIBLE LATERAL LIGAMENT RECONSTRUCTION AND HARDWARE REMOVAL;  Surgeon: Erle Crocker, MD;  Location: [place];  Service: Orthopedics;  Laterality: Right;  LENGTH: 210 HRS  . IM NAILING TIBIA Right    Allergies  Allergen Reactions  . Ibuprofen Shortness Of Breath   No current facility-administered medications on file prior to encounter.    Current Outpatient Medications on File Prior to Encounter  Medication Sig Dispense Refill  . acetaminophen (TYLENOL) 650 MG CR tablet Take 2 tablets (1,300 mg total) by mouth 2 (two) times daily. 90 tablet 3  . albuterol (VENTOLIN HFA) 108 (90 Base) MCG/ACT inhaler Inhale 2 puffs into the lungs every 6 (six) hours as needed for wheezing. 2 Inhaler 11  . azelastine (ASTELIN) 0.1 % nasal spray Place 2 sprays into both nostrils 2 (two) times daily. Use in each nostril as directed 301 mL 1  . fluticasone furoate-vilanterol (BREO ELLIPTA) 100-25 MCG/INH AEPB Inhale 1 puff into the lungs daily. 1 each 11  . montelukast (SINGULAIR) 10 MG tablet Take 1 tablet (10 mg total) by mouth at bedtime. 90 tablet 3     OBJECTIVE:  There were no vitals filed for this visit.   General appearance: alert; no distress Eyes: EOMI grossly; wears corrective lenses HENT: normocephalic; atraumatic Neck: supple with FROM Lungs: normal respiratory effort; speaking in full sentences without difficulty Extremities: moves extremities without difficulty Skin: No obvious rashes Neurologic: No facial asymmetries Psychological: alert and cooperative; normal mood and affect   ASSESSMENT & PLAN:  1. Acute bronchitis, unspecified organism   2. Mild  intermittent asthma with exacerbation     Meds ordered this encounter  Medications  . azithromycin (ZITHROMAX) 250 MG tablet    Sig: Take 1 tablet (250 mg total) by mouth daily. Take first 2 tablets together, then 1 every day until finished.    Dispense:  6 tablet    Refill:  0    Order Specific  Question:   Supervising Provider    Answer:   Eustace MooreNELSON, YVONNE SUE [[id number]]  . predniSONE (DELTASONE) 20 MG tablet    Sig: Take 1 tablet (20 mg total) by mouth 2 (two) times daily with a meal for 5 days.    Dispense:  10 tablet    Refill:  0    Order Specific Question:   Supervising Provider    Answer:   Eustace MooreELSON, YVONNE SUE [[id number]]   Patient declines covid testing at this time If you change your mind, or symptoms do not improve with medications, you may go [address] For [address] through testing  In the meantime: You should remain isolated in your home for 10 days from symptom onset AND greater than 72 hours after symptoms resolution (absence of fever without the use of fever-reducing medication and improvement in respiratory symptoms), whichever is longer Get plenty of rest and push fluids Will treat for possible bronchitis flare Prednisone prescribed.  Take as directed and to completion Z-pak prescribed.  Take as directed and to completion Use OTC medications like tylenol as needed fever or pain Follow up in [place], call or go to the ED if you have any new or worsening symptoms such as fever, worsening cough, shortness of breath, chest tightness, chest pain, turning blue, changes in mental status, etc...   I discussed the assessment and treatment plan with the patient. The patient was provided an opportunity to ask questions and all were answered. The patient agreed with the plan and demonstrated an understanding of the instructions.   The patient was advised to call back or seek an in-person evaluation if the symptoms worsen or if the condition fails to improve as anticipated.  I provided 10 minutes of non-face-to-face time during this encounter.     Rennis HardingWurst, Ayce Pietrzyk, PA-C 11/24/18 1740

## 2018-11-24 NOTE — Discharge Instructions (Addendum)
Patient declines covid testing at this time If you change your mind, or symptoms do not improve with medications, you may go to [place] For drive through testing  In the meantime: You should remain isolated in your home for 10 days from symptom onset AND greater than 72 hours after symptoms resolution (absence of fever without the use of fever-reducing medication and improvement in respiratory symptoms), whichever is longer Get plenty of rest and push fluids Will treat for possible bronchitis flare Prednisone prescribed.  Take as directed and to completion Z-pak prescribed.  Take as directed and to completion Use OTC medications like tylenol as needed fever or pain Follow up in [place], call or go to the ED if you have any new or worsening symptoms such as fever, worsening cough, shortness of breath, chest tightness, chest pain, turning blue, changes in mental status, etc..Marland Kitchen

## 2018-12-02 ENCOUNTER — Other Ambulatory Visit: Payer: Self-pay | Admitting: Orthopaedic Surgery

## 2018-12-02 DIAGNOSIS — M899 Disorder of bone, unspecified: Secondary | ICD-10-CM

## 2018-12-07 ENCOUNTER — Other Ambulatory Visit: Payer: Self-pay

## 2018-12-07 ENCOUNTER — Ambulatory Visit (INDEPENDENT_AMBULATORY_CARE_PROVIDER_SITE_OTHER): Payer: BLUE CROSS/BLUE SHIELD

## 2018-12-07 DIAGNOSIS — M899 Disorder of bone, unspecified: Secondary | ICD-10-CM

## 2018-12-07 DIAGNOSIS — M949 Disorder of cartilage, unspecified: Secondary | ICD-10-CM | POA: Diagnosis not present

## 2018-12-07 DIAGNOSIS — M25471 Effusion, right ankle: Secondary | ICD-10-CM | POA: Diagnosis not present

## 2018-12-10 DIAGNOSIS — M19071 Primary osteoarthritis, right ankle and foot: Secondary | ICD-10-CM | POA: Diagnosis not present

## 2019-01-22 ENCOUNTER — Ambulatory Visit (INDEPENDENT_AMBULATORY_CARE_PROVIDER_SITE_OTHER): Payer: BLUE CROSS/BLUE SHIELD

## 2019-01-22 ENCOUNTER — Other Ambulatory Visit: Payer: Self-pay | Admitting: Orthopaedic Surgery

## 2019-01-22 ENCOUNTER — Other Ambulatory Visit: Payer: Self-pay

## 2019-01-22 DIAGNOSIS — M19071 Primary osteoarthritis, right ankle and foot: Secondary | ICD-10-CM

## 2019-01-28 DIAGNOSIS — Z1159 Encounter for screening for other viral diseases: Secondary | ICD-10-CM | POA: Diagnosis not present

## 2019-01-28 DIAGNOSIS — N915 Oligomenorrhea, unspecified: Secondary | ICD-10-CM | POA: Diagnosis not present

## 2019-01-28 DIAGNOSIS — R0602 Shortness of breath: Secondary | ICD-10-CM | POA: Diagnosis not present

## 2019-01-28 DIAGNOSIS — E8881 Metabolic syndrome: Secondary | ICD-10-CM | POA: Diagnosis not present

## 2019-01-28 DIAGNOSIS — R5383 Other fatigue: Secondary | ICD-10-CM | POA: Diagnosis not present

## 2019-01-30 DIAGNOSIS — M19071 Primary osteoarthritis, right ankle and foot: Secondary | ICD-10-CM | POA: Diagnosis not present

## 2019-03-05 DIAGNOSIS — N915 Oligomenorrhea, unspecified: Secondary | ICD-10-CM | POA: Diagnosis not present

## 2019-03-05 DIAGNOSIS — Z6841 Body Mass Index (BMI) 40.0 and over, adult: Secondary | ICD-10-CM | POA: Diagnosis not present

## 2019-03-05 DIAGNOSIS — E663 Overweight: Secondary | ICD-10-CM | POA: Diagnosis not present

## 2019-04-15 DIAGNOSIS — M19071 Primary osteoarthritis, right ankle and foot: Secondary | ICD-10-CM | POA: Diagnosis not present

## 2019-07-05 ENCOUNTER — Other Ambulatory Visit: Payer: Self-pay | Admitting: Sports Medicine

## 2019-07-05 DIAGNOSIS — J301 Allergic rhinitis due to pollen: Secondary | ICD-10-CM

## 2019-08-24 DIAGNOSIS — E7800 Pure hypercholesterolemia, unspecified: Secondary | ICD-10-CM | POA: Diagnosis not present

## 2019-08-24 DIAGNOSIS — Z1159 Encounter for screening for other viral diseases: Secondary | ICD-10-CM | POA: Diagnosis not present

## 2019-08-24 DIAGNOSIS — N915 Oligomenorrhea, unspecified: Secondary | ICD-10-CM | POA: Diagnosis not present

## 2019-08-24 DIAGNOSIS — E559 Vitamin D deficiency, unspecified: Secondary | ICD-10-CM | POA: Diagnosis not present

## 2019-08-24 DIAGNOSIS — Z0000 Encounter for general adult medical examination without abnormal findings: Secondary | ICD-10-CM | POA: Diagnosis not present

## 2019-08-24 DIAGNOSIS — J4540 Moderate persistent asthma, uncomplicated: Secondary | ICD-10-CM | POA: Diagnosis not present

## 2019-08-24 DIAGNOSIS — Z20828 Contact with and (suspected) exposure to other viral communicable diseases: Secondary | ICD-10-CM | POA: Diagnosis not present

## 2019-08-24 DIAGNOSIS — E663 Overweight: Secondary | ICD-10-CM | POA: Diagnosis not present

## 2019-08-24 DIAGNOSIS — Z131 Encounter for screening for diabetes mellitus: Secondary | ICD-10-CM | POA: Diagnosis not present

## 2019-08-26 DIAGNOSIS — M19071 Primary osteoarthritis, right ankle and foot: Secondary | ICD-10-CM | POA: Diagnosis not present

## 2019-09-03 DIAGNOSIS — M19071 Primary osteoarthritis, right ankle and foot: Secondary | ICD-10-CM | POA: Diagnosis not present

## 2019-09-16 DIAGNOSIS — R7303 Prediabetes: Secondary | ICD-10-CM | POA: Diagnosis not present

## 2019-09-16 DIAGNOSIS — J4540 Moderate persistent asthma, uncomplicated: Secondary | ICD-10-CM | POA: Diagnosis not present

## 2019-09-16 DIAGNOSIS — E559 Vitamin D deficiency, unspecified: Secondary | ICD-10-CM | POA: Diagnosis not present

## 2019-09-16 DIAGNOSIS — N915 Oligomenorrhea, unspecified: Secondary | ICD-10-CM | POA: Diagnosis not present

## 2020-04-09 IMAGING — CT CT ANKLE*R* W/O CM
3 series · 13 of 33 positions shown, 16 images · non-contrast
Comparison: CT scan dated 03/21/2018 and MRI dated 12/07/2018

CLINICAL DATA: Chronic right ankle pain and tenderness. Arthritis
of the right ankle.

EXAM:
CT OF THE RIGHT ANKLE WITHOUT CONTRAST
TECHNIQUE: Multidetector CT imaging of the right ankle was performed according
to the standard protocol. Multiplanar CT image reconstructions were
also generated.

[Series 6: axial st · axial · 0.33mm/px · z∈[-250,-134]mm · 5 of 187 slices shown, 7 images]
[im 29/187  soft-tissue]
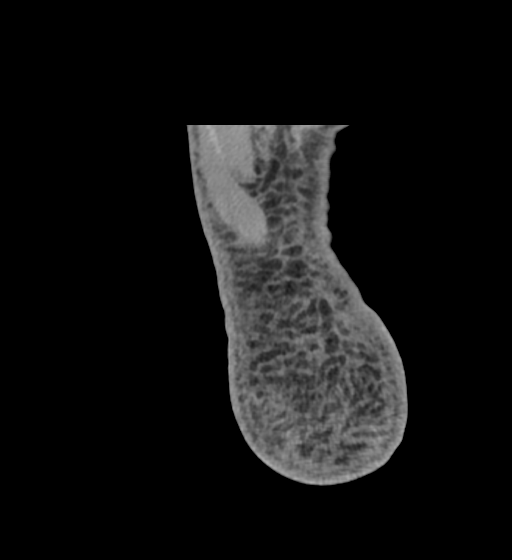
[im 29/187  bone]
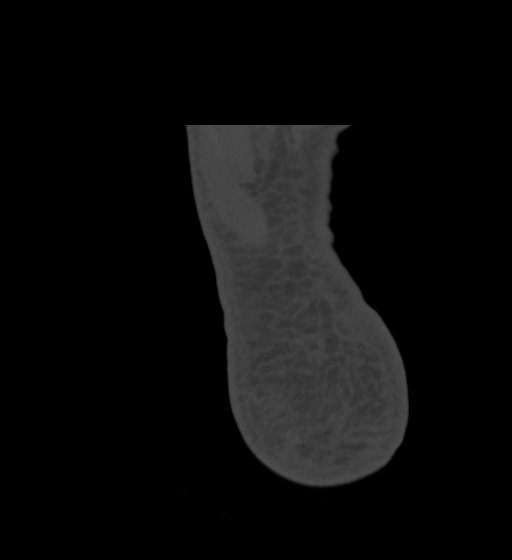
[im 58/187  bone]
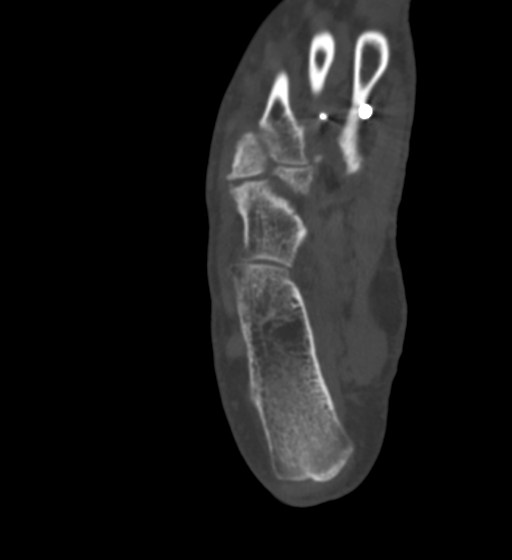
[im 101/187  bone]
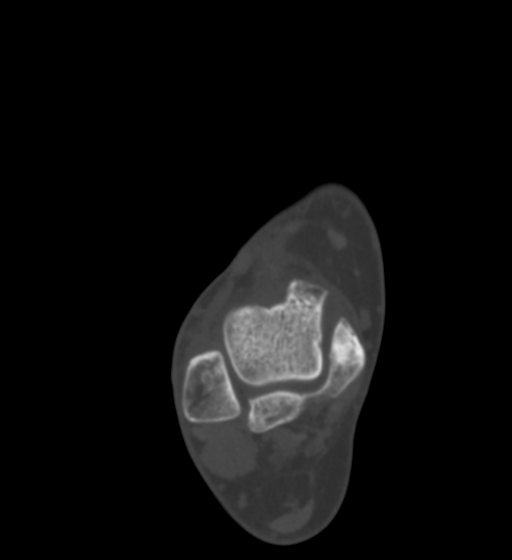
[im 129/187  bone]
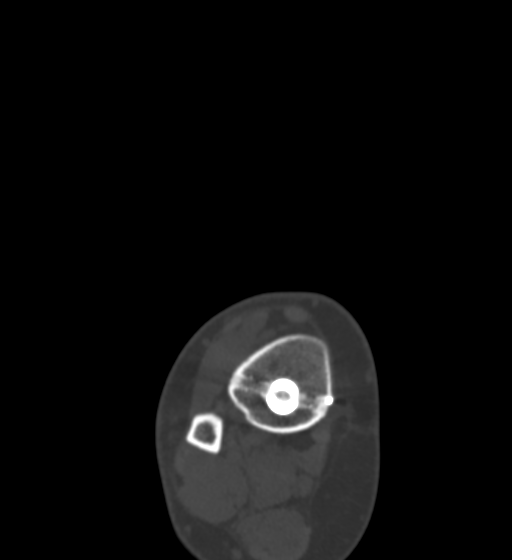
[im 158/187  soft-tissue]
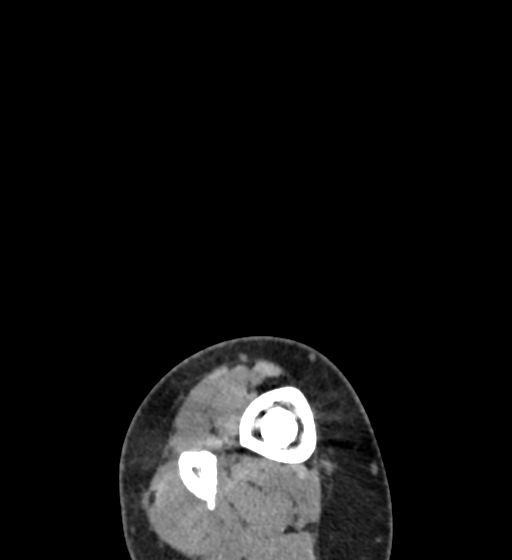
[im 158/187  bone]
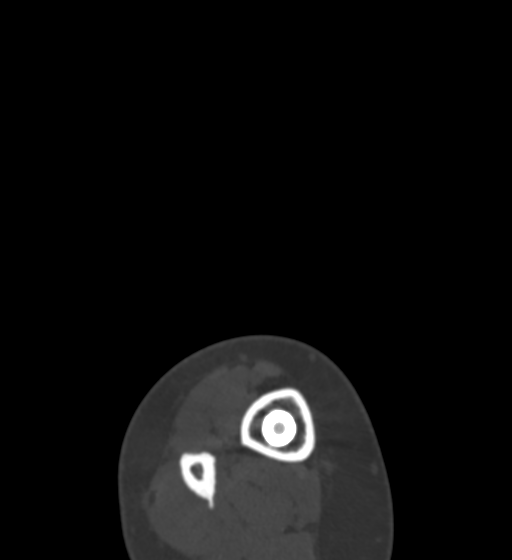

[Series 9: sag st · sagittal · 0.38mm/px · 5 of 122 slices shown, 6 images]
[im 41/122  bone]
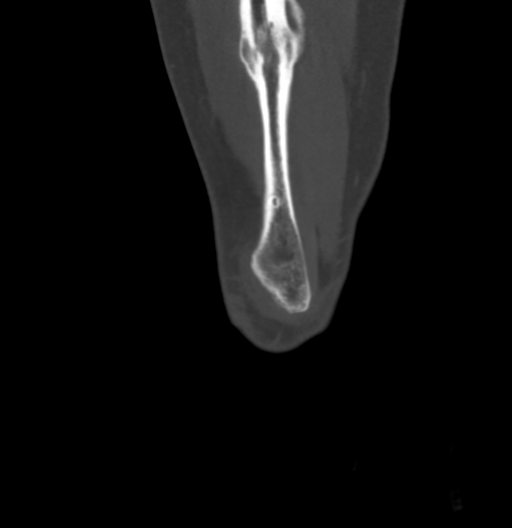
[im 51/122  bone]
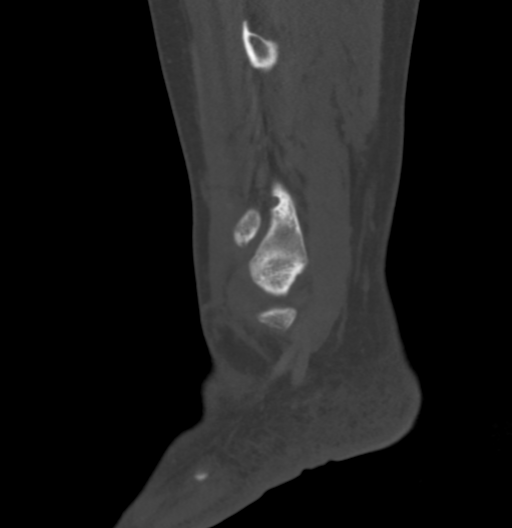
[im 61/122  soft-tissue]
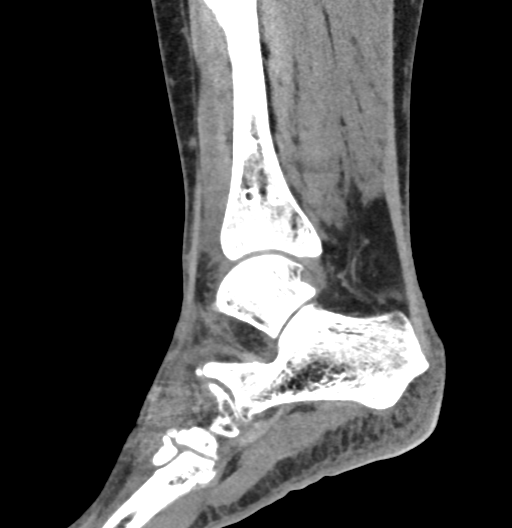
[im 61/122  bone]
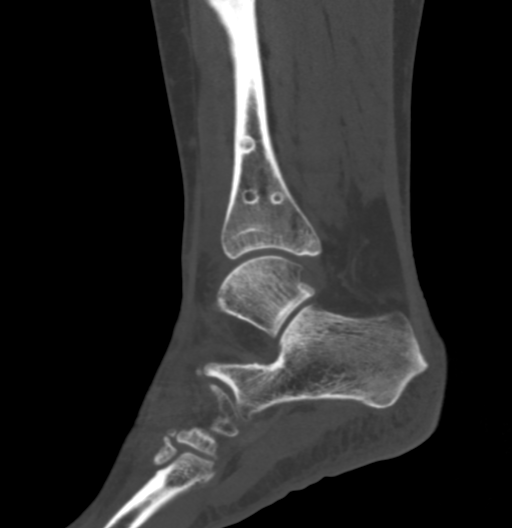
[im 71/122  bone]
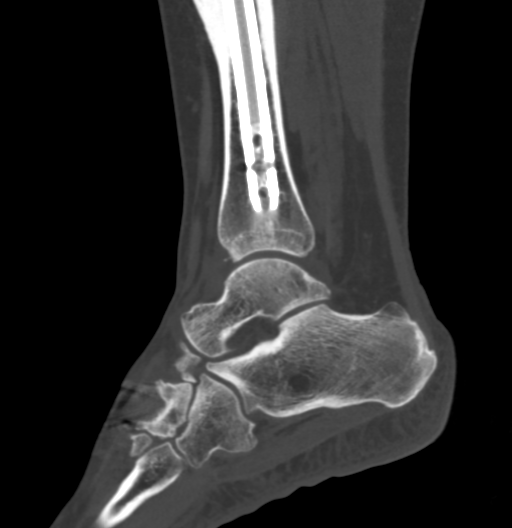
[im 81/122  bone]
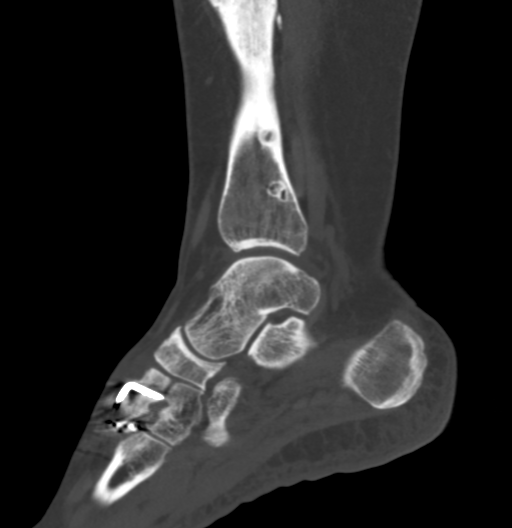

[Series 10: cor st · coronal · 0.23mm/px · 3 of 251 slices shown]
[im 51/251  bone]
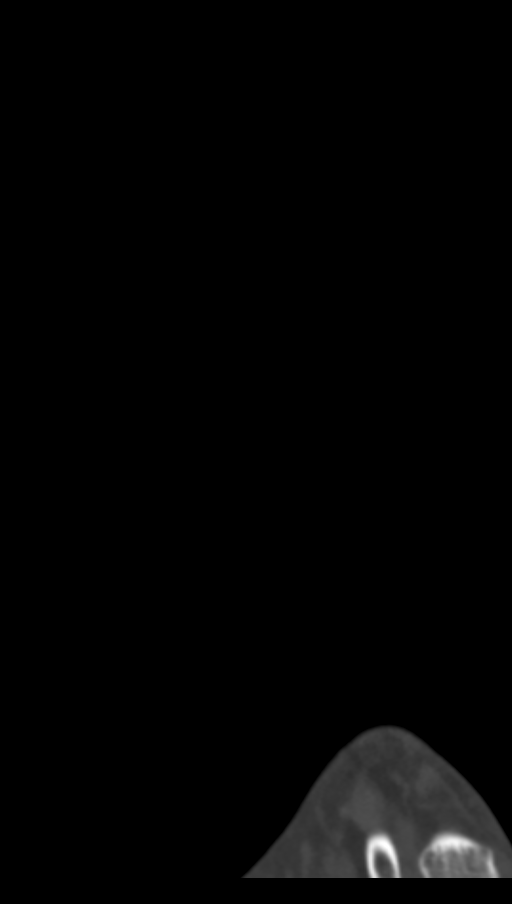
[im 101/251  bone]
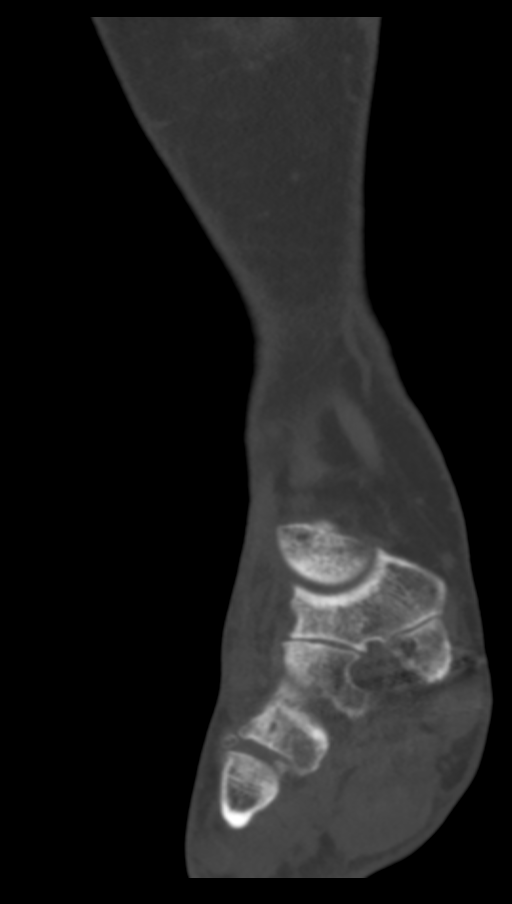
[im 151/251  bone]
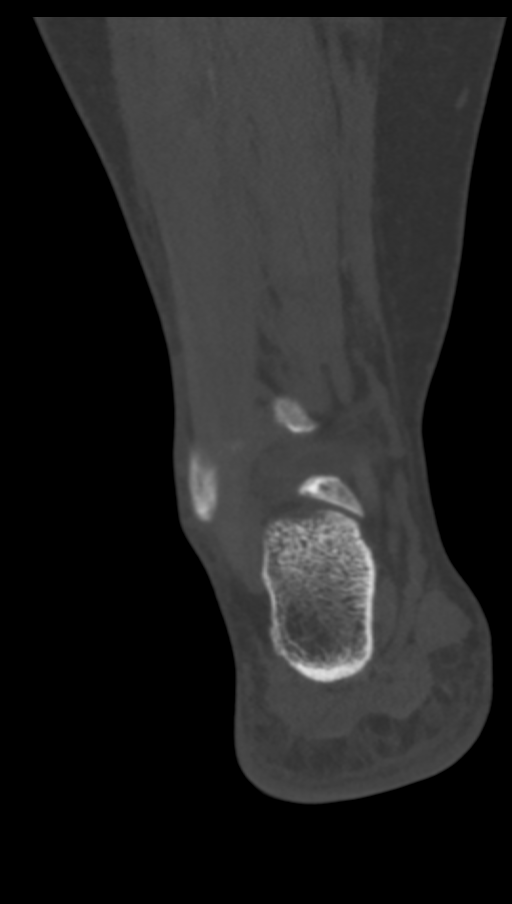

[13 of 33 positions shown; findings below may reference images not displayed]

FINDINGS: Bones/Joint/Cartilage

There are minimal arthritic changes of the tibiotalar joint. There
is a 5 mm area of superficial cortical erosion of the dome of the
talus seen on image 78 of series 8.

There are minimal arthritic changes at the calcaneocuboid joint and
between the navicular and cuneiforms. Solid arthrodesis at the first
and second tarsal metatarsal joints. Slight arthritic changes of the
other tarsal metatarsal joints.

Ligaments

Suboptimally assessed by CT. The posterior talofibular ligament is
intact. The other ligaments of the ankle are not well enough seen
for evaluation.

Muscles and Tendons

The tendons around the ankle appear normal.

Soft tissues

No significant abnormality.
IMPRESSION: 1. Solid arthrodesis at the first and second tarsal metatarsal
joints.
2. 5 mm area of superficial cortical erosion of the dome of the
talus.
3. Minimal arthritic changes of the tibiotalar joint and between the
navicular and cuneiforms.
4. Slight arthritic changes of the other tarsal metatarsal joints.

## 2020-06-16 ENCOUNTER — Encounter: Payer: Self-pay | Admitting: Sports Medicine

## 2020-06-16 ENCOUNTER — Other Ambulatory Visit: Payer: Self-pay

## 2020-06-16 ENCOUNTER — Ambulatory Visit (INDEPENDENT_AMBULATORY_CARE_PROVIDER_SITE_OTHER): Payer: 59 | Admitting: Sports Medicine

## 2020-06-16 VITALS — BP 136/92 | HR 87 | Ht 64.0 in | Wt 263.0 lb

## 2020-06-16 DIAGNOSIS — E6609 Other obesity due to excess calories: Secondary | ICD-10-CM

## 2020-06-16 DIAGNOSIS — M19071 Primary osteoarthritis, right ankle and foot: Secondary | ICD-10-CM | POA: Diagnosis not present

## 2020-06-16 DIAGNOSIS — J4540 Moderate persistent asthma, uncomplicated: Secondary | ICD-10-CM

## 2020-06-16 DIAGNOSIS — Z0000 Encounter for general adult medical examination without abnormal findings: Secondary | ICD-10-CM | POA: Diagnosis not present

## 2020-06-16 LAB — COMPREHENSIVE METABOLIC PANEL
Calcium: 9.3 mg/dL (ref 8.6–10.2)
Chloride: 105 mmol/L (ref 98–110)

## 2020-06-16 LAB — LIPID PANEL: Cholesterol: 189 mg/dL (ref <200)

## 2020-06-16 MED ORDER — CETIRIZINE HCL 10 MG PO TABS: 10.0000 mg | ORAL_TABLET | Freq: Every day | ORAL | 3 refills | Status: AC

## 2020-06-16 MED ORDER — MONTELUKAST SODIUM 10 MG PO TABS: 10.0000 mg | ORAL_TABLET | Freq: Every day | ORAL | 3 refills | Status: DC

## 2020-06-16 MED ORDER — BREO ELLIPTA 100-25 MCG/INH IN AEPB: 1.0000 | INHALATION_SPRAY | Freq: Every day | RESPIRATORY_TRACT | 11 refills | Status: DC

## 2020-06-16 NOTE — Assessment & Plan Note (Signed)
Switched insurance and was unable to get her controller meds, refilling Singulair, cetirizine, we will try Breo again as well.

## 2020-06-16 NOTE — Assessment & Plan Note (Signed)
X-rays notable modalities including medication, exercise techniques, noon, I did plant the seed today of gastric sleeve.  She will think about it and let me know.

## 2020-06-16 NOTE — Assessment & Plan Note (Signed)
Multiple interventions so far, she recently had a hardware removal with Dr. Susa Simmonds, foot is feeling much better.

## 2020-06-16 NOTE — Progress Notes (Signed)
Subjective:    CC: Annual Physical Exam  HPI:  This patient is here for their annual physical  I reviewed the past medical history, family history, social history, surgical history, and allergies today and no changes were needed.  Please see the problem list section below in epic for further details.  Past Medical History: Past Medical History:  Diagnosis Date  . Asthma, moderate persistent 10/28/2014  . Obesity 10/28/2014  . Tibia/fibula fracture r   Past Surgical History: Past Surgical History:  Procedure Laterality Date  . ARTHRODESIS METATARSAL Right 04/17/2018   Procedure: 1ST AND 2ND TARSOMETATARSAL ARTHRODESIS -W- POSSIBLE 3RD TARSOMETATARSAL FUSION AND ANKLE ARTHROSCOPY DEBRIDEMENT -W- POSSIBLE LATERAL LIGAMENT RECONSTRUCTION AND HARDWARE REMOVAL;  Surgeon: Terance Hart, MD;  Location: [facility] OR;  Service: Orthopedics;  Laterality: Right;  LENGTH: 210 HRS  . IM NAILING TIBIA Right    Social History: Social History   Socioeconomic History  . Marital status: Single    Spouse name: Not on file  . Number of children: Not on file  . Years of education: Not on file  . Highest education level: Not on file  Occupational History  . Not on file  Tobacco Use  . Smoking status: Never Smoker  . Smokeless tobacco: Never Used  Vaping Use  . Vaping Use: Never used  Substance and Sexual Activity  . Alcohol use: No  . Drug use: No  . Sexual activity: Not Currently    Birth control/protection: None  Other Topics Concern  . Not on file  Social History Narrative  . Not on file   Social Determinants of Health   Financial Resource Strain: Not on file  Food Insecurity: Not on file  Transportation Needs: Not on file  Physical Activity: Not on file  Stress: Not on file  Social Connections: Not on file   Family History: Family History  Problem Relation Age of Onset  . Diabetes Mother   . Hypertension Mother   . Diabetes Father   . Hypertension Father   . Stroke  Maternal Grandmother   . Stroke Maternal Grandfather    Allergies: Allergies  Allergen Reactions  . Ibuprofen Shortness Of Breath   Medications: See med rec.  Review of Systems: No headache, visual changes, nausea, vomiting, diarrhea, constipation, dizziness, abdominal pain, skin rash, fevers, chills, night sweats, swollen lymph nodes, weight loss, chest pain, body aches, joint swelling, muscle aches, shortness of breath, mood changes, visual or auditory hallucinations.  Objective:    General: Well Developed, well nourished, and in no acute distress.  Neuro: Alert and oriented x3, extra-ocular muscles intact, sensation grossly intact. Cranial nerves II through XII are intact, motor, sensory, and coordinative functions are all intact. HEENT: Normocephalic, atraumatic, pupils equal round reactive to light, neck supple, no masses, no lymphadenopathy, thyroid nonpalpable. Oropharynx, nasopharynx, external ear canals are unremarkable. Skin: Warm and dry, no rashes noted.  Cardiac: Regular rate and rhythm, no murmurs rubs or gallops.  Respiratory: Clear to auscultation bilaterally. Not using accessory muscles, speaking in full sentences.  Abdominal: Soft, nontender, nondistended, positive bowel sounds, no masses, no organomegaly.  Musculoskeletal: Shoulder, elbow, wrist, hip, knee, ankle stable, and with full range of motion.  Impression and Recommendations:    The patient was counselled, risk factors were discussed, anticipatory guidance given.  Annual physical exam Annual physical as above.  Asthma, moderate persistent Switched insurance and was unable to get her controller meds, refilling Singulair, cetirizine, we will try Breo again as well.  Obesity X-rays  notable modalities including medication, exercise techniques, noon, I did plant the seed today of gastric sleeve.  She will think about it and let me know.  Primary osteoarthritis of right foot Multiple interventions so far, she  recently had a hardware removal with Dr. Susa Simmonds, foot is feeling much better.   ___________________________________________ Ihor Austin. Benjamin Stain, M.D., ABFM., CAQSM. Primary Care and Sports Medicine [place] [facility]  Adjunct Professor of Family Medicine  [employer]

## 2020-06-16 NOTE — Assessment & Plan Note (Signed)
Annual physical as above.  

## 2020-06-17 LAB — CBC
HCT: 42.2 % (ref 35.0–45.0)
Hemoglobin: 14 g/dL (ref 11.7–15.5)
MCH: 26.8 pg — ABNORMAL LOW (ref 27.0–33.0)
MCHC: 33.2 g/dL (ref 32.0–36.0)
MCV: 80.7 fL (ref 80.0–100.0)
MPV: 11.3 fL (ref 7.5–12.5)
Platelets: 251 10*3/uL (ref 140–400)
RBC: 5.23 10*6/uL — ABNORMAL HIGH (ref 3.80–5.10)
RDW: 13.2 % (ref 11.0–15.0)
WBC: 5 10*3/uL (ref 3.8–10.8)

## 2020-06-17 LAB — COMPREHENSIVE METABOLIC PANEL
AG Ratio: 1.9 (calc) (ref 1.0–2.5)
ALT: 18 U/L (ref 6–29)
AST: 20 U/L (ref 10–30)
Albumin: 4.4 g/dL (ref 3.6–5.1)
Alkaline phosphatase (APISO): 68 U/L (ref 31–125)
BUN: 15 mg/dL (ref 7–25)
CO2: 26 mmol/L (ref 20–32)
Creat: 0.6 mg/dL (ref 0.50–1.10)
Globulin: 2.3 g/dL (calc) (ref 1.9–3.7)
Glucose, Bld: 81 mg/dL (ref 65–99)
Potassium: 4.4 mmol/L (ref 3.5–5.3)
Sodium: 138 mmol/L (ref 135–146)
Total Bilirubin: 0.4 mg/dL (ref 0.2–1.2)
Total Protein: 6.7 g/dL (ref 6.1–8.1)

## 2020-06-17 LAB — LIPID PANEL
HDL: 54 mg/dL (ref >=50)
LDL Cholesterol (Calc): 116 mg/dL (calc) — ABNORMAL HIGH
Non-HDL Cholesterol (Calc): 135 mg/dL (calc) — ABNORMAL HIGH (ref <130)
Total CHOL/HDL Ratio: 3.5 (calc) (ref <5.0)
Triglycerides: 89 mg/dL (ref <150)

## 2020-06-17 LAB — TSH: TSH: 2.5 mIU/L

## 2020-06-17 LAB — HEMOGLOBIN A1C
Hgb A1c MFr Bld: 5.5 % of total Hgb (ref <5.7)
Mean Plasma Glucose: 111 mg/dL
eAG (mmol/L): 6.2 mmol/L

## 2020-10-08 ENCOUNTER — Emergency Department: Admit: 2020-10-08 | Discharge status: Absent without leave | Payer: Self-pay

## 2020-12-12 MED ORDER — ALBUTEROL SULFATE HFA 108 (90 BASE) MCG/ACT IN AERS: 2.0000 | INHALATION_SPRAY | Freq: Four times a day (QID) | RESPIRATORY_TRACT | 5 refills | Status: DC | PRN

## 2021-06-07 ENCOUNTER — Ambulatory Visit (INDEPENDENT_AMBULATORY_CARE_PROVIDER_SITE_OTHER): Payer: 59 | Admitting: Sports Medicine

## 2021-06-07 ENCOUNTER — Other Ambulatory Visit: Payer: Self-pay | Admitting: Sports Medicine

## 2021-06-07 ENCOUNTER — Other Ambulatory Visit: Payer: Self-pay

## 2021-06-07 ENCOUNTER — Encounter: Payer: Self-pay | Admitting: Sports Medicine

## 2021-06-07 VITALS — BP 119/82 | HR 88 | Wt 279.0 lb

## 2021-06-07 DIAGNOSIS — Z0000 Encounter for general adult medical examination without abnormal findings: Secondary | ICD-10-CM

## 2021-06-07 DIAGNOSIS — E6609 Other obesity due to excess calories: Secondary | ICD-10-CM | POA: Diagnosis not present

## 2021-06-07 DIAGNOSIS — E611 Iron deficiency: Secondary | ICD-10-CM | POA: Insufficient documentation

## 2021-06-07 DIAGNOSIS — F5101 Primary insomnia: Secondary | ICD-10-CM | POA: Insufficient documentation

## 2021-06-07 DIAGNOSIS — E039 Hypothyroidism, unspecified: Secondary | ICD-10-CM | POA: Insufficient documentation

## 2021-06-07 DIAGNOSIS — J4540 Moderate persistent asthma, uncomplicated: Secondary | ICD-10-CM

## 2021-06-07 MED ORDER — WEGOVY 0.25 MG/0.5ML ~~LOC~~ SOAJ: 0.2500 mg | SUBCUTANEOUS | 0 refills | Status: DC

## 2021-06-07 MED ORDER — FLUTICASONE FUROATE-VILANTEROL 100-25 MCG/ACT IN AEPB: 1.0000 | INHALATION_SPRAY | Freq: Every day | RESPIRATORY_TRACT | 11 refills | Status: DC

## 2021-06-07 MED ORDER — PHENTERMINE HCL 37.5 MG PO TABS: ORAL_TABLET | ORAL | 0 refills | Status: DC

## 2021-06-07 NOTE — Assessment & Plan Note (Signed)
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

## 2021-06-07 NOTE — Assessment & Plan Note (Addendum)
Virginia May is also working hard on weight loss, she did go to a weight loss clinic and was prescribed a compounded version of semaglutide that she was able to get for about $180 a month at a compounding pharmacy in [place], she will get me the details. In the meantime we will work on getting her Reginal Lutes, refilling phentermine that she was also prescribed, we will do a month supply at a time with monthly weight checks and refills. She is going to be part of a multidisciplinary weight loss program with diet, exercise, calorie counting. For insurance company purposes she will not be on any additional weight loss medications when she is on Wegovy.

## 2021-06-07 NOTE — Progress Notes (Signed)
Subjective:    CC: Annual Physical Exam  HPI:  This patient is here for their annual physical  I reviewed the past medical history, family history, social history, surgical history, and allergies today and no changes were needed.  Please see the problem list section below in epic for further details.  Past Medical History: Past Medical History:  Diagnosis Date   Asthma, moderate persistent 10/28/2014   Obesity 10/28/2014   Tibia/fibula fracture r   Past Surgical History: Past Surgical History:  Procedure Laterality Date   ARTHRODESIS METATARSAL Right 04/17/2018   Procedure: 1ST AND 2ND TARSOMETATARSAL ARTHRODESIS -W- POSSIBLE 3RD TARSOMETATARSAL FUSION AND ANKLE ARTHROSCOPY DEBRIDEMENT -W- POSSIBLE LATERAL LIGAMENT RECONSTRUCTION AND HARDWARE REMOVAL;  Surgeon: Terance Hart, MD;  Location: [facility] OR;  Service: Orthopedics;  Laterality: Right;  LENGTH: 210 HRS   IM NAILING TIBIA Right    Social History: Social History   Socioeconomic History   Marital status: Single    Spouse name: Not on file   Number of children: Not on file   Years of education: Not on file   Highest education level: Not on file  Occupational History   Not on file  Tobacco Use   Smoking status: Never   Smokeless tobacco: Never  Vaping Use   Vaping Use: Never used  Substance and Sexual Activity   Alcohol use: No   Drug use: No   Sexual activity: Not Currently    Birth control/protection: None  Other Topics Concern   Not on file  Social History Narrative   Not on file   Social Determinants of Health   Financial Resource Strain: Not on file  Food Insecurity: Not on file  Transportation Needs: Not on file  Physical Activity: Not on file  Stress: Not on file  Social Connections: Not on file   Family History: Family History  Problem Relation Age of Onset   Diabetes Mother    Hypertension Mother    Diabetes Father    Hypertension Father    Stroke Maternal Grandmother    Stroke Maternal  Grandfather    Allergies: Allergies  Allergen Reactions   Ibuprofen Shortness Of Breath   Medications: See med rec.  Review of Systems: No headache, visual changes, nausea, vomiting, diarrhea, constipation, dizziness, abdominal pain, skin rash, fevers, chills, night sweats, swollen lymph nodes, weight loss, chest pain, body aches, joint swelling, muscle aches, shortness of breath, mood changes, visual or auditory hallucinations.  Objective:    General: Well Developed, well nourished, and in no acute distress.  Neuro: Alert and oriented x3, extra-ocular muscles intact, sensation grossly intact. Cranial nerves II through XII are intact, motor, sensory, and coordinative functions are all intact. HEENT: Normocephalic, atraumatic, pupils equal round reactive to light, neck supple, no masses, no lymphadenopathy, thyroid nonpalpable. Oropharynx, nasopharynx, external ear canals are unremarkable. Skin: Warm and dry, no rashes noted.  Cardiac: Regular rate and rhythm, no murmurs rubs or gallops.  Respiratory: Trace expiratory wheezes right lung field.  Not using accessory muscles, speaking in full sentences.  Abdominal: Soft, nontender, nondistended, positive bowel sounds, no masses, no organomegaly.  Musculoskeletal: Shoulder, elbow, wrist, hip, knee, ankle stable, and with full range of motion.  Impression and Recommendations:    The patient was counselled, risk factors were discussed, anticipatory guidance given.  Annual physical exam Annual physical as above, declines flu shot. She will be getting her cervical cancer screening soon. Routine labs done today  Obesity Stavroula is also working hard on weight loss,  she did go to a weight loss clinic and was prescribed a compounded version of semaglutide that she was able to get for about $180 a month at a compounding pharmacy in [place], she will get me the details. In the meantime we will work on getting her Reginal Lutes, refilling phentermine that  she was also prescribed, we will do a month supply at a time with monthly weight checks and refills. She is going to be part of a multidisciplinary weight loss program with diet, exercise, calorie counting. For insurance company purposes she will not be on any additional weight loss medications when she is on Wegovy.  Asthma, moderate persistent Asthma was historically controlled on Breo, insurance stopped covering it, she has a new insurance plan so we will try Breo again, if unable to get this approved we will switch to Symbicort.  With regards to obesity and asthma these are chronic processes with exacerbations and pharmacologic intervention.   ___________________________________________ Ihor Austin. Benjamin Stain, M.D., ABFM., CAQSM. Primary Care and Sports Medicine [facility]  Adjunct Professor of Family Medicine  [employer]

## 2021-06-07 NOTE — Assessment & Plan Note (Signed)
Annual physical as above, declines flu shot. She will be getting her cervical cancer screening soon. Routine labs done today

## 2021-06-08 LAB — COMPREHENSIVE METABOLIC PANEL
AG Ratio: 2 (calc) (ref 1.0–2.5)
ALT: 12 U/L (ref 6–29)
AST: 17 U/L (ref 10–30)
Albumin: 4.5 g/dL (ref 3.6–5.1)
Alkaline phosphatase (APISO): 70 U/L (ref 31–125)
BUN: 14 mg/dL (ref 7–25)
CO2: 26 mmol/L (ref 20–32)
Calcium: 9.4 mg/dL (ref 8.6–10.2)
Chloride: 107 mmol/L (ref 98–110)
Creat: 0.64 mg/dL (ref 0.50–0.97)
Globulin: 2.3 g/dL (calc) (ref 1.9–3.7)
Glucose, Bld: 86 mg/dL (ref 65–99)
Potassium: 4.3 mmol/L (ref 3.5–5.3)
Sodium: 140 mmol/L (ref 135–146)
Total Bilirubin: 0.4 mg/dL (ref 0.2–1.2)
Total Protein: 6.8 g/dL (ref 6.1–8.1)

## 2021-06-08 LAB — CBC
HCT: 44.1 % (ref 35.0–45.0)
Hemoglobin: 14.3 g/dL (ref 11.7–15.5)
MCH: 25.5 pg — ABNORMAL LOW (ref 27.0–33.0)
MCHC: 32.4 g/dL (ref 32.0–36.0)
MCV: 78.6 fL — ABNORMAL LOW (ref 80.0–100.0)
MPV: 11.4 fL (ref 7.5–12.5)
Platelets: 256 10*3/uL (ref 140–400)
RBC: 5.61 10*6/uL — ABNORMAL HIGH (ref 3.80–5.10)
RDW: 14.2 % (ref 11.0–15.0)
WBC: 4.8 10*3/uL (ref 3.8–10.8)

## 2021-06-08 LAB — LIPID PANEL
Cholesterol: 160 mg/dL (ref <200)
HDL: 50 mg/dL (ref >=50)
LDL Cholesterol (Calc): 95 mg/dL (calc)
Non-HDL Cholesterol (Calc): 110 mg/dL (calc) (ref <130)
Total CHOL/HDL Ratio: 3.2 (calc) (ref <5.0)
Triglycerides: 66 mg/dL (ref <150)

## 2021-06-08 LAB — HEMOGLOBIN A1C
Hgb A1c MFr Bld: 5.5 % of total Hgb (ref <5.7)
Mean Plasma Glucose: 111 mg/dL
eAG (mmol/L): 6.2 mmol/L

## 2021-06-08 LAB — TSH: TSH: 2.12 mIU/L

## 2021-06-08 MED ORDER — BUDESONIDE-FORMOTEROL FUMARATE 160-4.5 MCG/ACT IN AERO: 1.0000 | INHALATION_SPRAY | Freq: Two times a day (BID) | RESPIRATORY_TRACT | 3 refills | Status: DC

## 2021-06-13 ENCOUNTER — Telehealth: Payer: Self-pay

## 2021-06-13 DIAGNOSIS — E6609 Other obesity due to excess calories: Secondary | ICD-10-CM

## 2021-06-13 MED ORDER — SEMAGLUTIDE (2 MG/DOSE) 8 MG/3ML ~~LOC~~ SOPN: PEN_INJECTOR | SUBCUTANEOUS | 3 refills | Status: DC

## 2021-06-13 NOTE — Telephone Encounter (Signed)
Sent!

## 2021-06-13 NOTE — Telephone Encounter (Addendum)
Initiated Prior authorization WNU:UVOZDG 0.25 MG/0.5ML SOAJ Via: Covermymeds Case/Key: [id number] Status: Denied  as of 06/13/21 Reason:Wegovy 0.25MG /0.5ML Cruger SOAJ for you. Weve denied the request for the following reason(s): *Drug Not Covered/Plan Exclusion - Your request for coverage was denied because your prescription benefit plan does not cover the requested medication. Notified Pt via: Mychart

## 2021-06-13 NOTE — Telephone Encounter (Signed)
No, unfortunately there is not an alternative since this is a plan exclusion., please ask her if she would like me to send in the compounded version to the compounding pharmacy to get for cheaper?

## 2021-06-13 NOTE — Addendum Note (Signed)
Addended by: Monica Becton on: 06/13/2021 03:11 PM   Modules accepted: Orders

## 2021-06-20 DIAGNOSIS — Z124 Encounter for screening for malignant neoplasm of cervix: Secondary | ICD-10-CM | POA: Diagnosis not present

## 2021-06-20 DIAGNOSIS — N939 Abnormal uterine and vaginal bleeding, unspecified: Secondary | ICD-10-CM | POA: Insufficient documentation

## 2021-06-20 DIAGNOSIS — R69 Illness, unspecified: Secondary | ICD-10-CM | POA: Diagnosis not present

## 2021-06-20 DIAGNOSIS — Z1151 Encounter for screening for human papillomavirus (HPV): Secondary | ICD-10-CM | POA: Diagnosis not present

## 2021-06-20 DIAGNOSIS — Z01419 Encounter for gynecological examination (general) (routine) without abnormal findings: Secondary | ICD-10-CM | POA: Diagnosis not present

## 2021-07-07 ENCOUNTER — Ambulatory Visit: Payer: 59 | Admitting: Sports Medicine

## 2021-07-11 ENCOUNTER — Other Ambulatory Visit: Payer: Self-pay | Admitting: Sports Medicine

## 2021-07-11 ENCOUNTER — Other Ambulatory Visit: Payer: Self-pay

## 2021-07-11 ENCOUNTER — Ambulatory Visit: Payer: 59 | Admitting: Sports Medicine

## 2021-07-11 ENCOUNTER — Encounter: Payer: Self-pay | Admitting: Sports Medicine

## 2021-07-11 DIAGNOSIS — E6609 Other obesity due to excess calories: Secondary | ICD-10-CM

## 2021-07-11 NOTE — Progress Notes (Signed)
? ? ?  Procedures performed today:   ? ?None. ? ?Independent interpretation of notes and tests performed by another provider:  ? ?None. ? ?Brief History, Exam, Impression, and Recommendations:   ? ?Obesity ?Only on low-dose compounded semaglutide weekly injection right now. ?20 units which is 0.5 mg. ?We do need to take her monthly up to the full dose of 96 units / 2.4 mg. ?I would like to see her back in 3 months. ? ? ? ?___________________________________________ ?Ihor Austin. Benjamin Stain, M.D., ABFM., CAQSM. ?Primary Care and Sports Medicine ?[facility] ? ?Adjunct Instructor of Family Medicine  ?[employer] ?

## 2021-07-11 NOTE — Assessment & Plan Note (Signed)
Only on low-dose compounded semaglutide weekly injection right now. ?20 units which is 0.5 mg. ?We do need to take her monthly up to the full dose of 96 units / 2.4 mg. ?I would like to see her back in 3 months. ?

## 2021-07-12 ENCOUNTER — Encounter: Payer: Self-pay | Admitting: Sports Medicine

## 2021-07-12 DIAGNOSIS — E6609 Other obesity due to excess calories: Secondary | ICD-10-CM

## 2021-07-12 MED ORDER — PHENTERMINE HCL 37.5 MG PO TABS: ORAL_TABLET | ORAL | 0 refills | Status: DC

## 2021-08-08 DIAGNOSIS — L0100 Impetigo, unspecified: Secondary | ICD-10-CM | POA: Diagnosis not present

## 2021-08-10 MED ORDER — PHENTERMINE HCL 37.5 MG PO TABS: ORAL_TABLET | ORAL | 0 refills | Status: DC

## 2021-08-10 MED ORDER — TOPIRAMATE 50 MG PO TABS: ORAL_TABLET | ORAL | 0 refills | Status: DC

## 2021-08-10 NOTE — Assessment & Plan Note (Signed)
Update from 07/11/2021, only on low-dose compounded semaglutide weekly injection, 20 units which is 0.5 mg, we do need to take her monthly up to the full dose of 96 units / 2.4 mg. ?She did message me late April asking to restart phentermine as well, Topamax, so we will do this too. ?We will need to switch back to monthly weight checks and refills with the phentermine. ?

## 2021-08-10 NOTE — Addendum Note (Signed)
Addended by: Monica Becton on: 08/10/2021 08:58 AM ? ? Modules accepted: Orders ? ?

## 2021-08-14 ENCOUNTER — Telehealth: Payer: 59 | Admitting: Nurse Practitioner

## 2021-08-14 DIAGNOSIS — B379 Candidiasis, unspecified: Secondary | ICD-10-CM

## 2021-08-14 MED ORDER — FLUCONAZOLE 150 MG PO TABS: 150.0000 mg | ORAL_TABLET | ORAL | 0 refills | Status: DC

## 2021-08-14 NOTE — Progress Notes (Signed)
?Virtual Visit Consent  ? ?Virginia May, you are scheduled for a virtual visit with a [facility] provider today.   ?  ?Just as with appointments in the office, your consent must be obtained to participate.  Your consent will be active for this visit and any virtual visit you may have with one of our providers in the next 365 days.   ?  ?If you have a MyChart account, a copy of this consent can be sent to you electronically.  All virtual visits are billed to your insurance company just like a traditional visit in the office.   ? ?As this is a virtual visit, video technology does not allow for your provider to perform a traditional examination.  This may limit your provider's ability to fully assess your condition.  If your provider identifies any concerns that need to be evaluated in person or the need to arrange testing (such as labs, EKG, etc.), we will make arrangements to do so.   ?  ?Although advances in technology are sophisticated, we cannot ensure that it will always work on either your end or our end.  If the connection with a video visit is poor, the visit may have to be switched to a telephone visit.  With either a video or telephone visit, we are not always able to ensure that we have a secure connection.    ? ?Also, by engaging in this virtual visit, you consent to the provision of healthcare. Additionally, you authorize for your insurance to be billed (if applicable) for the services provided during this visit.  ? ?I need to obtain your verbal consent now.   Are you willing to proceed with your visit today?  ?  ?Virginia May has provided verbal consent on 08/14/2021 for a virtual visit (video or telephone). ?  ?Virginia May  ? ?Date: 08/14/2021 8:41 AM ? ? ?Virtual Visit via Video Note  ? ?[place], May, connected with  Virginia May  ([id number], 12-10-1985) on 08/14/21 at  8:45 AM EDT by a video-enabled telemedicine application and verified that I am speaking with the correct person using two  identifiers. ? ?Location: ?Patient: Virtual Visit Location Patient: Home ?Provider: Virtual Visit Location Provider: Home Office ?  ?I discussed the limitations of evaluation and management by telemedicine and the availability of in person appointments. The patient expressed understanding and agreed to proceed.   ? ?History of Present Illness: ?Virginia May is a 36 y.o. who identifies as a female who was assigned female at birth, and is being seen today with complaints of burning and itching vaginally with white discharge- she has been on oral antibiotics. She has tried OTC without relief.  ? ? ?She was seen at the [facility] last week for a skin infection on her face. She was placed on topical cream and an oral antibiotics. She was having great improvement with the cream and stopped the oral antibiotics.  ? ?Denies any urinary symptoms  ? ?Problems:  ?Patient Active Problem List  ? Diagnosis Date Noted  ? Hypothyroid 06/07/2021  ? Iron deficiency 06/07/2021  ? Primary insomnia 06/07/2021  ? Annual physical exam 06/07/2021  ? Seasonal allergic rhinitis 07/21/2018  ? Primary osteoarthritis of right foot 10/16/2017  ? Obesity 10/28/2014  ? Asthma, moderate persistent 10/28/2014  ?  ?Allergies:  ?Allergies  ?Allergen Reactions  ? Ibuprofen Shortness Of Breath  ? ?Medications:  ?Current Outpatient Medications:  ?  albuterol (VENTOLIN HFA) 108 (90  Base) MCG/ACT inhaler, Inhale 2 puffs into the lungs every 6 (six) hours as needed for wheezing., Disp: 2 each, Rfl: 5 ?  budesonide-formoterol (SYMBICORT) 160-4.5 MCG/ACT inhaler, Inhale 1 puff into the lungs 2 (two) times daily., Disp: 1 each, Rfl: 3 ?  cetirizine (ZYRTEC) 10 MG tablet, Take 1 tablet (10 mg total) by mouth daily., Disp: 90 tablet, Rfl: 3 ?  montelukast (SINGULAIR) 10 MG tablet, Take 1 tablet (10 mg total) by mouth at bedtime., Disp: 90 tablet, Rfl: 3 ?  norethindrone-ethinyl estradiol-FE (LOESTRIN FE) 1-20 MG-MCG tablet, Take 1 tablet by mouth daily.,  Disp: , Rfl:  ?  phentermine (ADIPEX-P) 37.5 MG tablet, One tab by mouth qAM, Disp: 30 tablet, Rfl: 0 ?  Semaglutide, 2 MG/DOSE, 8 MG/3ML SOPN, Semaglutide 2.5 mg/mL. Inject 20 units (0.5 mg) subcutaneous weekly for 4 weeks, then 40 units (1 mg) weekly for 4 weeks, then 68 units (1.7mg ) weekly for 4 weeks, then 96 units (2.4mg ) weekly., Disp: 2 mL, Rfl: 3 ?  topiramate (TOPAMAX) 50 MG tablet, One half tab p.o. nightly for 1 week then 1 tab p.o. nightly for 1 week then 1 tab p.o. twice daily, Disp: 60 tablet, Rfl: 0 ? ?Observations/Objective: ?Patient is well-developed, well-nourished in no acute distress.  ?Resting comfortably  at home.  ?Head is normocephalic, atraumatic.  ?No labored breathing.  ?Speech is clear and coherent with logical content.  ?Patient is alert and oriented at baseline.  ? ? ?Assessment and Plan: ?1. Yeast infection ? ?- fluconazole (DIFLUCAN) 150 MG tablet; Take 1 tablet (150 mg total) by mouth as directed. Repeat X1 after 72 hours as needed  Dispense: 2 tablet; Refill: 0 ?   ?Follow Up Instructions: ?I discussed the assessment and treatment plan with the patient. The patient was provided an opportunity to ask questions and all were answered. The patient agreed with the plan and demonstrated an understanding of the instructions.  A copy of instructions were sent to the patient via MyChart unless otherwise noted below.  ? ? ?The patient was advised to call back or seek an in-person evaluation if the symptoms worsen or if the condition fails to improve as anticipated. ? ?Time:  ?I spent 10 minutes with the patient via telehealth technology discussing the above problems/concerns.   ? ?Virginia May  ?

## 2021-09-01 ENCOUNTER — Other Ambulatory Visit: Payer: Self-pay | Admitting: Sports Medicine

## 2021-09-01 DIAGNOSIS — E6609 Other obesity due to excess calories: Secondary | ICD-10-CM

## 2021-10-04 DIAGNOSIS — R2240 Localized swelling, mass and lump, unspecified lower limb: Secondary | ICD-10-CM | POA: Diagnosis not present

## 2021-10-04 DIAGNOSIS — M79672 Pain in left foot: Secondary | ICD-10-CM | POA: Diagnosis not present

## 2021-10-13 ENCOUNTER — Ambulatory Visit: Payer: 59 | Admitting: Sports Medicine

## 2021-10-30 ENCOUNTER — Encounter (INDEPENDENT_AMBULATORY_CARE_PROVIDER_SITE_OTHER): Payer: 59 | Admitting: Sports Medicine

## 2021-10-30 DIAGNOSIS — E6609 Other obesity due to excess calories: Secondary | ICD-10-CM | POA: Diagnosis not present

## 2021-10-30 MED ORDER — SEMAGLUTIDE (2 MG/DOSE) 8 MG/3ML ~~LOC~~ SOPN
PEN_INJECTOR | SUBCUTANEOUS | 3 refills | Status: DC
Start: 2021-10-30 — End: 2022-05-16

## 2021-10-30 NOTE — Telephone Encounter (Signed)
I spent 5 total minutes of online digital evaluation and management services in this patient-initiated request for online care. 

## 2021-11-26 ENCOUNTER — Other Ambulatory Visit: Payer: Self-pay | Admitting: Sports Medicine

## 2021-11-27 DIAGNOSIS — M79672 Pain in left foot: Secondary | ICD-10-CM | POA: Diagnosis not present

## 2021-11-28 ENCOUNTER — Other Ambulatory Visit: Payer: Self-pay | Admitting: Orthopaedic Surgery

## 2021-11-28 DIAGNOSIS — M79672 Pain in left foot: Secondary | ICD-10-CM

## 2021-12-05 ENCOUNTER — Other Ambulatory Visit: Payer: 59

## 2021-12-06 ENCOUNTER — Ambulatory Visit
Admission: RE | Admit: 2021-12-06 | Discharge: 2021-12-06 | Disposition: A | Discharge status: Home / Self Care | Payer: 59 | Source: Ambulatory Visit | Attending: Orthopaedic Surgery | Admitting: Orthopaedic Surgery

## 2021-12-06 DIAGNOSIS — M79672 Pain in left foot: Secondary | ICD-10-CM

## 2021-12-13 DIAGNOSIS — M79672 Pain in left foot: Secondary | ICD-10-CM | POA: Diagnosis not present

## 2021-12-21 ENCOUNTER — Other Ambulatory Visit: Payer: Self-pay | Admitting: Orthopaedic Surgery

## 2022-01-02 DIAGNOSIS — N938 Other specified abnormal uterine and vaginal bleeding: Secondary | ICD-10-CM | POA: Diagnosis not present

## 2022-01-02 DIAGNOSIS — Z3009 Encounter for other general counseling and advice on contraception: Secondary | ICD-10-CM | POA: Diagnosis not present

## 2022-01-05 DIAGNOSIS — J018 Other acute sinusitis: Secondary | ICD-10-CM | POA: Diagnosis not present

## 2022-01-05 DIAGNOSIS — R051 Acute cough: Secondary | ICD-10-CM | POA: Diagnosis not present

## 2022-01-05 DIAGNOSIS — Z6841 Body Mass Index (BMI) 40.0 and over, adult: Secondary | ICD-10-CM | POA: Diagnosis not present

## 2022-01-12 NOTE — Progress Notes (Signed)
Surgical Instructions    Your procedure is scheduled on Tuesday October 10th.  Report to [facility] Main Entrance "A" at 5:30 A.M., then check in with the Admitting office.  Call this number if you have problems the morning of surgery:  [phone]   If you have any questions prior to your surgery date call [phone]: Open Monday-Friday 8am-4pm If you experience any cold or flu symptoms such as cough, fever, chills, shortness of breath, etc. between now and your scheduled surgery, please notify us at the above number     Remember:  Do not eat after midnight the night before your surgery  You may drink clear liquids until 4:30am the morning of your surgery.   Clear liquids allowed are: Water, Non-Citrus Juices (without pulp), Carbonated Beverages, Clear Tea, Black Coffee ONLY (NO MILK, CREAM OR POWDERED CREAMER of any kind), and Gatorade  Enhanced Recovery after Surgery for Orthopedics Enhanced Recovery after Surgery is a protocol used to improve the stress on your body and your recovery after surgery.  Patient Instructions  The day of surgery (if you do NOT have diabetes):  Drink ONE (1) Pre-Surgery Clear Ensure by __4:30___ am the morning of surgery   This drink was given to you during your hospital  pre-op appointment visit. Nothing else to drink after completing the  Pre-Surgery Clear Ensure.          If you have questions, please contact your surgeon's office.    Take these medicines the morning of surgery with A SIP OF WATER: budesonide-formoterol (SYMBICORT) 160-4.5 MCG/ACT inhaler cetirizine (ZYRTEC) 10 MG tablet   IF NEEDED  albuterol (VENTOLIN HFA) 108 (90 Base) MCG/ACT inhaler   -Please bring all inhalers with you to the hospital     As of today, STOP taking any Aspirin (unless otherwise instructed by your surgeon) Aleve, Naproxen, Ibuprofen, Motrin, Advil, Goody's, BC's, all herbal medications, fish oil, and all vitamins.           Do not wear jewelry  or makeup. Do not wear lotions, powders, perfumes or deodorant. Do not shave 48 hours prior to surgery.   Do not bring valuables to the hospital. Do not wear nail polish, gel polish, artificial nails, or any other type of covering on natural nails (fingers and toes) If you have artificial nails or gel coating that need to be removed by a nail salon, please have this removed prior to surgery. Artificial nails or gel coating may interfere with anesthesia's ability to adequately monitor your vital signs.  [facility] is not responsible for any belongings or valuables.    Do NOT Smoke (Tobacco/Vaping)  24 hours prior to your procedure  If you use a CPAP at night, you may bring your mask for your overnight stay.   Contacts, glasses, hearing aids, dentures or partials may not be worn into surgery, please bring cases for these belongings   For patients admitted to the hospital, discharge time will be determined by your treatment team.   Patients discharged the day of surgery will not be allowed to drive home, and someone needs to stay with them for 24 hours.   SURGICAL WAITING ROOM VISITATION Patients having surgery or a procedure may have no more than 2 support people in the waiting area - these visitors may rotate.   Children under the age of 46 must have an adult with them who is not the patient. If the patient needs to stay at the hospital during part of their recovery,  the visitor guidelines for inpatient rooms apply. Pre-op nurse will coordinate an appropriate time for 1 support person to accompany patient in pre-op.  This support person may not rotate.   Please refer to the [facility] website for the visitor guidelines for Inpatients (after your surgery is over and you are in a regular room).    Special instructions:    Oral Hygiene is also important to reduce your risk of infection.  Remember - BRUSH YOUR TEETH THE MORNING OF SURGERY WITH YOUR REGULAR TOOTHPASTE   [facility]-  Preparing For Surgery  Before surgery, you can play an important role. Because skin is not sterile, your skin needs to be as free of germs as possible. You can reduce the number of germs on your skin by washing with CHG (chlorahexidine gluconate) Soap before surgery.  CHG is an antiseptic cleaner which kills germs and bonds with the skin to continue killing germs even after washing.     Please do not use if you have an allergy to CHG or antibacterial soaps. If your skin becomes reddened/irritated stop using the CHG.  Do not shave (including legs and underarms) for at least 48 hours prior to first CHG shower. It is OK to shave your face.  Please follow these instructions carefully.     Shower the NIGHT BEFORE SURGERY and the MORNING OF SURGERY with CHG Soap.   If you chose to wash your hair, wash your hair first as usual with your normal shampoo. After you shampoo, rinse your hair and body thoroughly to remove the shampoo.  Then [employer] and genitals (private parts) with your normal soap and rinse thoroughly to remove soap.  After that Use CHG Soap as you would any other liquid soap. You can apply CHG directly to the skin and wash gently with a scrungie or a clean washcloth.   Apply the CHG Soap to your body ONLY FROM THE NECK DOWN.  Do not use on open wounds or open sores. Avoid contact with your eyes, ears, mouth and genitals (private parts). Wash Face and genitals (private parts)  with your normal soap.   Wash thoroughly, paying special attention to the area where your surgery will be performed.  Thoroughly rinse your body with warm water from the neck down.  DO NOT shower/wash with your normal soap after using and rinsing off the CHG Soap.  Pat yourself dry with a CLEAN TOWEL.  Wear CLEAN PAJAMAS to bed the night before surgery  Place CLEAN SHEETS on your bed the night before your surgery  DO NOT SLEEP WITH PETS.   Day of Surgery:  Take a shower with CHG soap. Wear  Clean/Comfortable clothing the morning of surgery Do not apply any deodorants/lotions.   Remember to brush your teeth WITH YOUR REGULAR TOOTHPASTE.    If you received a COVID test during your pre-op visit, it is requested that you wear a mask when out in public, stay away from anyone that may not be feeling well, and notify your surgeon if you develop symptoms. If you have been in contact with anyone that has tested positive in the last 10 days, please notify your surgeon.    Please read over the following fact sheets that you were given.

## 2022-01-15 ENCOUNTER — Encounter (HOSPITAL_COMMUNITY)
Admission: RE | Admit: 2022-01-15 | Discharge: 2022-01-15 | Disposition: A | Discharge status: Home / Self Care | Payer: 59 | Source: Ambulatory Visit | Attending: Orthopaedic Surgery | Admitting: Orthopaedic Surgery

## 2022-01-15 ENCOUNTER — Other Ambulatory Visit: Payer: Self-pay

## 2022-01-15 ENCOUNTER — Encounter (HOSPITAL_COMMUNITY): Payer: Self-pay

## 2022-01-15 VITALS — BP 124/91 | HR 84 | Temp 97.6°F | Resp 18 | Ht 63.0 in | Wt 279.3 lb

## 2022-01-15 DIAGNOSIS — Z01818 Encounter for other preprocedural examination: Secondary | ICD-10-CM

## 2022-01-15 DIAGNOSIS — Z01812 Encounter for preprocedural laboratory examination: Secondary | ICD-10-CM | POA: Insufficient documentation

## 2022-01-15 HISTORY — DX: Headache, unspecified: R51.9

## 2022-01-15 HISTORY — DX: Other specified postprocedural states: Z98.890

## 2022-01-15 LAB — CBC
HCT: 42.1 % (ref 36.0–46.0)
Hemoglobin: 13.7 g/dL (ref 12.0–15.0)
MCH: 26.4 pg (ref 26.0–34.0)
MCHC: 32.5 g/dL (ref 30.0–36.0)
MCV: 81.3 fL (ref 80.0–100.0)
Platelets: 217 10*3/uL (ref 150–400)
RBC: 5.18 MIL/uL — ABNORMAL HIGH (ref 3.87–5.11)
RDW: 14 % (ref 11.5–15.5)
WBC: 5.4 10*3/uL (ref 4.0–10.5)
nRBC: 0 % (ref 0.0–0.2)

## 2022-01-15 NOTE — Progress Notes (Signed)
PCP - Virginia May Cardiologist - denies  PPM/ICD - denies   Chest x-ray - n/a EKG - n/a Stress Test - denies ECHO - denies Cardiac Cath - denies  Sleep Study - denies   No diabetes  Follow your surgeon's instructions on when to stop Aspirin.  If no instructions were given by your surgeon then you will need to call the office to get those instructions.     ERAS Protcol -yes PRE-SURGERY Ensure or G2- ensure ordered and given  COVID TEST- not needed   Anesthesia review: no  Patient denies shortness of breath, fever, cough and chest pain at PAT appointment   All instructions explained to the patient, with a verbal understanding of the material. Patient agrees to go over the instructions while at home for a better understanding. Patient also instructed to self quarantine after being tested for COVID-19. The opportunity to ask questions was provided.

## 2022-01-16 DIAGNOSIS — Z30430 Encounter for insertion of intrauterine contraceptive device: Secondary | ICD-10-CM | POA: Diagnosis not present

## 2022-01-22 NOTE — Anesthesia Preprocedure Evaluation (Signed)
Anesthesia Evaluation  Patient identified by MRN, date of birth, ID band Patient awake    Reviewed: Allergy & Precautions, NPO status , Patient's Chart, lab work & pertinent test results  History of Anesthesia Complications (+) PONV and history of anesthetic complications  Airway Mallampati: II       Dental no notable dental hx.    Pulmonary asthma ,    Pulmonary exam normal        Cardiovascular negative cardio ROS Normal cardiovascular exam     Neuro/Psych  Headaches, negative psych ROS   GI/Hepatic negative GI ROS, Neg liver ROS,   Endo/Other  Morbid obesity  Renal/GU negative Renal ROS  negative genitourinary   Musculoskeletal  (+) Arthritis , Osteoarthritis,    Abdominal (+) + obese,   Peds  Hematology   Anesthesia Other Findings   Reproductive/Obstetrics negative OB ROS                            Anesthesia Physical  Anesthesia Plan  ASA: III  Anesthesia Plan: Regional and General   Post-op Pain Management:  Regional for Post-op pain and Regional block*   Induction: Intravenous  PONV Risk Score and Plan: 4 or greater and Ondansetron, Treatment may vary due to age or medical condition, Midazolam and Dexamethasone  Airway Management Planned: LMA  Additional Equipment: None  Intra-op Plan:   Post-operative Plan: Extubation in OR  Informed Consent: I have reviewed the patients History and Physical, chart, labs and discussed the procedure including the risks, benefits and alternatives for the proposed anesthesia with the patient or authorized representative who has indicated his/her understanding and acceptance.     Dental advisory given  Plan Discussed with: CRNA  Anesthesia Plan Comments:       Anesthesia Quick Evaluation

## 2022-01-23 ENCOUNTER — Encounter (HOSPITAL_COMMUNITY): Payer: Self-pay | Admitting: Orthopaedic Surgery

## 2022-01-23 ENCOUNTER — Encounter (HOSPITAL_COMMUNITY)
Admission: RE | Disposition: A | Discharge status: Home / Self Care | Payer: Self-pay | Source: Ambulatory Visit | Attending: Orthopaedic Surgery

## 2022-01-23 ENCOUNTER — Other Ambulatory Visit: Payer: Self-pay

## 2022-01-23 ENCOUNTER — Ambulatory Visit (HOSPITAL_COMMUNITY): Payer: 59

## 2022-01-23 ENCOUNTER — Ambulatory Visit (HOSPITAL_COMMUNITY)
Admission: RE | Admit: 2022-01-23 | Discharge: 2022-01-23 | Disposition: A | Discharge status: Home / Self Care | Payer: 59 | Source: Ambulatory Visit | Attending: Orthopaedic Surgery | Admitting: Orthopaedic Surgery

## 2022-01-23 ENCOUNTER — Ambulatory Visit (HOSPITAL_COMMUNITY): Payer: 59 | Admitting: Anesthesiology

## 2022-01-23 ENCOUNTER — Ambulatory Visit (HOSPITAL_BASED_OUTPATIENT_CLINIC_OR_DEPARTMENT_OTHER): Payer: 59 | Admitting: Anesthesiology

## 2022-01-23 DIAGNOSIS — M216X2 Other acquired deformities of left foot: Secondary | ICD-10-CM | POA: Insufficient documentation

## 2022-01-23 DIAGNOSIS — J4540 Moderate persistent asthma, uncomplicated: Secondary | ICD-10-CM | POA: Insufficient documentation

## 2022-01-23 DIAGNOSIS — S93525A Sprain of metatarsophalangeal joint of left lesser toe(s), initial encounter: Secondary | ICD-10-CM | POA: Diagnosis not present

## 2022-01-23 DIAGNOSIS — M79672 Pain in left foot: Secondary | ICD-10-CM | POA: Diagnosis not present

## 2022-01-23 DIAGNOSIS — M65872 Other synovitis and tenosynovitis, left ankle and foot: Secondary | ICD-10-CM

## 2022-01-23 DIAGNOSIS — M1990 Unspecified osteoarthritis, unspecified site: Secondary | ICD-10-CM | POA: Diagnosis not present

## 2022-01-23 DIAGNOSIS — J45909 Unspecified asthma, uncomplicated: Secondary | ICD-10-CM | POA: Diagnosis not present

## 2022-01-23 DIAGNOSIS — S93145A Subluxation of metatarsophalangeal joint of left lesser toe(s), initial encounter: Secondary | ICD-10-CM | POA: Insufficient documentation

## 2022-01-23 DIAGNOSIS — M205X2 Other deformities of toe(s) (acquired), left foot: Secondary | ICD-10-CM

## 2022-01-23 DIAGNOSIS — G8918 Other acute postprocedural pain: Secondary | ICD-10-CM | POA: Diagnosis not present

## 2022-01-23 DIAGNOSIS — Z01818 Encounter for other preprocedural examination: Secondary | ICD-10-CM

## 2022-01-23 DIAGNOSIS — M7742 Metatarsalgia, left foot: Secondary | ICD-10-CM | POA: Diagnosis not present

## 2022-01-23 DIAGNOSIS — Z6841 Body Mass Index (BMI) 40.0 and over, adult: Secondary | ICD-10-CM | POA: Insufficient documentation

## 2022-01-23 DIAGNOSIS — X58XXXA Exposure to other specified factors, initial encounter: Secondary | ICD-10-CM | POA: Diagnosis not present

## 2022-01-23 DIAGNOSIS — S96812A Strain of other specified muscles and tendons at ankle and foot level, left foot, initial encounter: Secondary | ICD-10-CM | POA: Diagnosis not present

## 2022-01-23 HISTORY — PX: REPAIR EXTENSOR TENDON WITH METATARSAL OSTEOTOMY AND OPEN REDUCTION IN: SHX5698

## 2022-01-23 HISTORY — PX: RECONSTRUCTION OF ANGULAR DEFORMITY,TOE: SHX6564

## 2022-01-23 LAB — POCT PREGNANCY, URINE: Preg Test, Ur: NEGATIVE

## 2022-01-23 SURGERY — REPAIR EXTENSOR TENDON WITH METATARSAL OSTEOTOMY AND OPEN REDUCTION INTERNAL FIXATION (ORIF) METATARSAL
Anesthesia: Regional | Site: Toe | Laterality: Left

## 2022-01-23 MED ORDER — OXYCODONE HCL 5 MG PO TABS: 5.0000 mg | ORAL_TABLET | ORAL | 0 refills | Status: AC | PRN

## 2022-01-23 MED ORDER — SCOPOLAMINE 1 MG/3DAYS TD PT72
MEDICATED_PATCH | TRANSDERMAL | Status: AC
  Filled 2022-01-23: qty 1

## 2022-01-23 MED ORDER — ROPIVACAINE HCL 5 MG/ML IJ SOLN
INTRAMUSCULAR | Status: DC | PRN
  Administered 2022-01-23 (×10): 5 mL via PERINEURAL

## 2022-01-23 MED ORDER — CLONIDINE HCL (ANALGESIA) 100 MCG/ML EP SOLN
EPIDURAL | Status: DC | PRN
  Administered 2022-01-23: 100 ug

## 2022-01-23 MED ORDER — MIDAZOLAM HCL 2 MG/2ML IJ SOLN
INTRAMUSCULAR | Status: DC | PRN
  Administered 2022-01-23: 2 mg via INTRAVENOUS

## 2022-01-23 MED ORDER — SCOPOLAMINE 1 MG/3DAYS TD PT72
MEDICATED_PATCH | TRANSDERMAL | Status: DC | PRN
  Administered 2022-01-23: 1 via TRANSDERMAL

## 2022-01-23 MED ORDER — ONDANSETRON HCL 4 MG/2ML IJ SOLN
INTRAMUSCULAR | Status: DC | PRN
  Administered 2022-01-23 (×2): 4 mg via INTRAVENOUS

## 2022-01-23 MED ORDER — FENTANYL CITRATE (PF) 250 MCG/5ML IJ SOLN
INTRAMUSCULAR | Status: AC
  Filled 2022-01-23: qty 5

## 2022-01-23 MED ORDER — PROPOFOL 500 MG/50ML IV EMUL
INTRAVENOUS | Status: DC | PRN
  Administered 2022-01-23: 50 ug/kg/min via INTRAVENOUS

## 2022-01-23 MED ORDER — DEXAMETHASONE SODIUM PHOSPHATE 10 MG/ML IJ SOLN
INTRAMUSCULAR | Status: DC | PRN
  Administered 2022-01-23: 10 mg via INTRAVENOUS

## 2022-01-23 MED ORDER — ORAL CARE MOUTH RINSE: 15.0000 mL | Freq: Once | OROMUCOSAL | Status: AC

## 2022-01-23 MED ORDER — CEFAZOLIN IN SODIUM CHLORIDE 3-0.9 GM/100ML-% IV SOLN
3.0000 g | INTRAVENOUS | Status: AC
  Administered 2022-01-23: 3 g via INTRAVENOUS
  Filled 2022-01-23: qty 100

## 2022-01-23 MED ORDER — LACTATED RINGERS IV SOLN: INTRAVENOUS | Status: DC

## 2022-01-23 MED ORDER — LIDOCAINE 2% (20 MG/ML) 5 ML SYRINGE
INTRAMUSCULAR | Status: DC | PRN
  Administered 2022-01-23: 100 mg via INTRAVENOUS

## 2022-01-23 MED ORDER — PHENYLEPHRINE HCL-NACL 20-0.9 MG/250ML-% IV SOLN
INTRAVENOUS | Status: DC | PRN
  Administered 2022-01-23: 10 ug/min via INTRAVENOUS

## 2022-01-23 MED ORDER — MIDAZOLAM HCL 2 MG/2ML IJ SOLN
INTRAMUSCULAR | Status: AC
  Filled 2022-01-23: qty 2

## 2022-01-23 MED ORDER — PROPOFOL 10 MG/ML IV BOLUS
INTRAVENOUS | Status: DC | PRN
  Administered 2022-01-23: 200 mg via INTRAVENOUS

## 2022-01-23 MED ORDER — 0.9 % SODIUM CHLORIDE (POUR BTL) OPTIME
TOPICAL | Status: DC | PRN
  Administered 2022-01-23: 1000 mL

## 2022-01-23 MED ORDER — PHENYLEPHRINE 80 MCG/ML (10ML) SYRINGE FOR IV PUSH (FOR BLOOD PRESSURE SUPPORT)
PREFILLED_SYRINGE | INTRAVENOUS | Status: DC | PRN
  Administered 2022-01-23: 80 ug via INTRAVENOUS
  Administered 2022-01-23: 160 ug via INTRAVENOUS
  Administered 2022-01-23 (×2): 80 ug via INTRAVENOUS

## 2022-01-23 MED ORDER — FENTANYL CITRATE (PF) 250 MCG/5ML IJ SOLN
INTRAMUSCULAR | Status: DC | PRN
  Administered 2022-01-23: 200 ug via INTRAVENOUS

## 2022-01-23 MED ORDER — CHLORHEXIDINE GLUCONATE 0.12 % MT SOLN
15.0000 mL | Freq: Once | OROMUCOSAL | Status: AC
  Administered 2022-01-23: 15 mL via OROMUCOSAL
  Filled 2022-01-23: qty 15

## 2022-01-23 SURGICAL SUPPLY — 70 items
ALCOHOL 70% 16 OZ (MISCELLANEOUS) ×2 IMPLANT
APL PRP STRL LF DISP 70% ISPRP (MISCELLANEOUS) ×2
BAG COUNTER SPONGE SURGICOUNT (BAG) ×2 IMPLANT
BAG SPNG CNTER NS LX DISP (BAG) ×1
BANDAGE ESMARK 6X9 LF (GAUZE/BANDAGES/DRESSINGS) IMPLANT
BIT DRILL SHORT 1.5 (BIT) IMPLANT
BLADE LONG MED 31X9 (MISCELLANEOUS) IMPLANT
BLADE SURG 15 STRL LF DISP TIS (BLADE) ×2 IMPLANT
BLADE SURG 15 STRL SS (BLADE) ×2
BNDG CMPR 9X6 STRL LF SNTH (GAUZE/BANDAGES/DRESSINGS)
BNDG CMPR MED 10X6 ELC LF (GAUZE/BANDAGES/DRESSINGS) ×1
BNDG CMPR STD VLCR NS LF 5.8X4 (GAUZE/BANDAGES/DRESSINGS) ×1
BNDG COHESIVE 4X5 TAN STRL (GAUZE/BANDAGES/DRESSINGS) IMPLANT
BNDG COHESIVE 6X5 TAN STRL LF (GAUZE/BANDAGES/DRESSINGS) IMPLANT
BNDG ELASTIC 4X5.8 VLCR NS LF (GAUZE/BANDAGES/DRESSINGS) IMPLANT
BNDG ELASTIC 6X10 VLCR STRL LF (GAUZE/BANDAGES/DRESSINGS) ×2 IMPLANT
BNDG ESMARK 6X9 LF (GAUZE/BANDAGES/DRESSINGS)
CANISTER SUCT 3000ML PPV (MISCELLANEOUS) ×2 IMPLANT
CHLORAPREP W/TINT 26 (MISCELLANEOUS) ×4 IMPLANT
COVER SURGICAL LIGHT HANDLE (MISCELLANEOUS) ×2 IMPLANT
CUFF TOURN SGL QUICK 34 (TOURNIQUET CUFF) ×1
CUFF TOURN SGL QUICK 42 (TOURNIQUET CUFF) IMPLANT
CUFF TRNQT CYL 34X4.125X (TOURNIQUET CUFF) ×2 IMPLANT
DRAPE OEC MINIVIEW 54X84 (DRAPES) ×2 IMPLANT
DRAPE U-SHAPE 47X51 STRL (DRAPES) ×2 IMPLANT
DRSG MEPITEL 4X7.2 (GAUZE/BANDAGES/DRESSINGS) ×2 IMPLANT
DRSG XEROFORM 1X8 (GAUZE/BANDAGES/DRESSINGS) ×2 IMPLANT
ELECT REM PT RETURN 9FT ADLT (ELECTROSURGICAL) ×1
ELECTRODE REM PT RTRN 9FT ADLT (ELECTROSURGICAL) ×2 IMPLANT
GAUZE PAD ABD 8X10 STRL (GAUZE/BANDAGES/DRESSINGS) ×4 IMPLANT
GAUZE SPONGE 4X4 12PLY STRL (GAUZE/BANDAGES/DRESSINGS) IMPLANT
GAUZE SPONGE 4X4 12PLY STRL LF (GAUZE/BANDAGES/DRESSINGS) ×2 IMPLANT
GLOVE BIOGEL M STRL SZ7.5 (GLOVE) ×2 IMPLANT
GLOVE BIOGEL PI IND STRL 8 (GLOVE) ×2 IMPLANT
GLOVE SRG 8 PF TXTR STRL LF DI (GLOVE) ×2 IMPLANT
GLOVE SURG ENC TEXT LTX SZ7.5 (GLOVE) ×2 IMPLANT
GLOVE SURG UNDER POLY LF SZ8 (GLOVE) ×1
GOWN STRL REUS W/ TWL LRG LVL3 (GOWN DISPOSABLE) ×2 IMPLANT
GOWN STRL REUS W/ TWL XL LVL3 (GOWN DISPOSABLE) ×4 IMPLANT
GOWN STRL REUS W/TWL LRG LVL3 (GOWN DISPOSABLE) ×1
GOWN STRL REUS W/TWL XL LVL3 (GOWN DISPOSABLE) ×2
IMP SYS CPR MINI SCORP (Miscellaneous) ×1 IMPLANT
IMPL SYS CPR MINI SCORP (Miscellaneous) IMPLANT
K-WIRE DBL TROCAR .045X4 (WIRE) ×1
KIT BASIN OR (CUSTOM PROCEDURE TRAY) ×2 IMPLANT
KIT TURNOVER KIT B (KITS) ×2 IMPLANT
KWIRE DBL TROCAR .045X4 (WIRE) IMPLANT
Microaire K-wire Dual Trocar PTS 1.1 mm x 102 mm IMPLANT
NS IRRIG 1000ML POUR BTL (IV SOLUTION) ×2 IMPLANT
PACK ORTHO EXTREMITY (CUSTOM PROCEDURE TRAY) ×2 IMPLANT
PAD ARMBOARD 7.5X6 YLW CONV (MISCELLANEOUS) ×4 IMPLANT
PAD CAST 4YDX4 CTTN HI CHSV (CAST SUPPLIES) ×2 IMPLANT
PAD CAST CTTN 4X4 STRL (SOFTGOODS) IMPLANT
PADDING CAST COTTON 4X4 STRL (CAST SUPPLIES)
PADDING CAST COTTON 4X4 STRL (SOFTGOODS) ×1
PADDING CAST SYNTHETIC 4X4 STR (CAST SUPPLIES) IMPLANT
SCREW CORT LP 2X18 (Screw) IMPLANT
SPLINT PLASTER CAST FAST 5X30 (CAST SUPPLIES) IMPLANT
SPONGE T-LAP 18X18 ~~LOC~~+RFID (SPONGE) ×2 IMPLANT
SUCTION FRAZIER HANDLE 10FR (MISCELLANEOUS) ×1
SUCTION TUBE FRAZIER 10FR DISP (MISCELLANEOUS) ×2 IMPLANT
SUT ETHILON 3 0 PS 1 (SUTURE) ×2 IMPLANT
SUT FIBERWIRE 2-0 18 17.9 3/8 (SUTURE) ×1
SUT MNCRL AB 3-0 PS2 27 (SUTURE) ×2 IMPLANT
SUT VIC AB 2-0 CT1 27 (SUTURE)
SUT VIC AB 2-0 CT1 TAPERPNT 27 (SUTURE) ×4 IMPLANT
SUTURE FIBERWR 2-0 18 17.9 3/8 (SUTURE) IMPLANT
TOWEL GREEN STERILE (TOWEL DISPOSABLE) ×2 IMPLANT
TOWEL GREEN STERILE FF (TOWEL DISPOSABLE) ×2 IMPLANT
TUBE CONNECTING 12X1/4 (SUCTIONS) ×2 IMPLANT

## 2022-01-23 NOTE — Addendum Note (Signed)
Addendum  created 01/23/22 1515 by Lyn Hollingshead, MD   Child order released for a procedure order, Clinical Note Signed, Image imported, Intraprocedure Blocks edited, Intraprocedure Meds edited, SmartForm saved

## 2022-01-23 NOTE — Transfer of Care (Signed)
Immediate Anesthesia Transfer of Care Note  Patient: Virginia May  Procedure(s) Performed: LEFT SECOND METATARSOPHALANGEAL JOINT CAPSULOTOMY, SECOND METATARSAL OSTEOTOMY, PLANTAR PLATE RECONSTRUCTION (Left) ANGULAR CORRECTION OF SECOND TOE (Left: Toe)  Patient Location: PACU  Anesthesia Type:GA combined with regional for post-op pain  Level of Consciousness: awake, alert  and oriented  Airway & Oxygen Therapy: Patient Spontanous Breathing  Post-op Assessment: Report given to RN and Post -op Vital signs reviewed and stable  Post vital signs: Reviewed and stable  Last Vitals:  Vitals Value Taken Time  BP 106/60   Temp 36   Pulse 86   Resp 16   SpO2 96     Last Pain:  Vitals:   01/23/22 0628  TempSrc:   PainSc: 0-No pain         Complications: No notable events documented.

## 2022-01-23 NOTE — H&P (Signed)
PREOPERATIVE H&P  Chief Complaint: Left foot pain  HPI: Virginia May is a 36 y.o. female who presents for preoperative history and physical with a diagnosis of left second MTP plantar plate tear with deviation of the MTP joint.  She is here today for surgical correction. Symptoms are rated as moderate to severe, and have been worsening.  This is significantly impairing activities of daily living.  She has elected for surgical management.   Past Medical History:  Diagnosis Date   Asthma, moderate persistent 10/28/2014   Headache    Obesity 10/28/2014   PONV (postoperative nausea and vomiting)    Tibia/fibula fracture r   Past Surgical History:  Procedure Laterality Date   ARTHRODESIS METATARSAL Right 04/17/2018   Procedure: 1ST AND 2ND TARSOMETATARSAL ARTHRODESIS -W- POSSIBLE 3RD TARSOMETATARSAL FUSION AND ANKLE ARTHROSCOPY DEBRIDEMENT -W- POSSIBLE LATERAL LIGAMENT RECONSTRUCTION AND HARDWARE REMOVAL;  Surgeon: Erle Crocker, MD;  Location: [facility];  Service: Orthopedics;  Laterality: Right;  LENGTH: 210 HRS   IM NAILING TIBIA Right    Social History   Socioeconomic History   Marital status: Single    Spouse name: Not on file   Number of children: Not on file   Years of education: Not on file   Highest education level: Not on file  Occupational History   Not on file  Tobacco Use   Smoking status: Never   Smokeless tobacco: Never  Vaping Use   Vaping Use: Never used  Substance and Sexual Activity   Alcohol use: No   Drug use: No   Sexual activity: Not Currently    Birth control/protection: None  Other Topics Concern   Not on file  Social History Narrative   Not on file   Social Determinants of Health   Financial Resource Strain: Not on file  Food Insecurity: Not on file  Transportation Needs: Not on file  Physical Activity: Not on file  Stress: Not on file  Social Connections: Not on file   Family History  Problem Relation Age of Onset   Diabetes Mother     Hypertension Mother    Diabetes Father    Hypertension Father    Stroke Maternal Grandmother    Stroke Maternal Grandfather    Allergies  Allergen Reactions   Ibuprofen Shortness Of Breath   Prior to Admission medications   Medication Sig Start Date End Date Taking? Authorizing Provider  albuterol (VENTOLIN HFA) 108 (90 Base) MCG/ACT inhaler INHALE 2 PUFFS INTO THE LUNGS EVERY 6 HOURS AS NEEDED FOR WHEEZE 11/27/21  Yes Silverio Decamp, MD  budesonide-formoterol (SYMBICORT) 160-4.5 MCG/ACT inhaler Inhale 1 puff into the lungs 2 (two) times daily. 06/08/21  Yes Silverio Decamp, MD  cetirizine (ZYRTEC) 10 MG tablet Take 1 tablet (10 mg total) by mouth daily. 06/16/20  Yes Silverio Decamp, MD  fluconazole (DIFLUCAN) 150 MG tablet Take 1 tablet (150 mg total) by mouth as directed. Repeat X1 after 72 hours as needed Patient not taking: Reported on 01/09/2022 08/14/21   Apolonio Schneiders, FNP  montelukast (SINGULAIR) 10 MG tablet Take 1 tablet (10 mg total) by mouth at bedtime. Patient not taking: Reported on 01/09/2022 06/16/20   Silverio Decamp, MD  phentermine (ADIPEX-P) 37.5 MG tablet One tab by mouth qAM Patient not taking: Reported on 01/09/2022 08/10/21   Silverio Decamp, MD  Semaglutide, 2 MG/DOSE, 8 MG/3ML SOPN Semaglutide 2.5 mg/mL + VIt B6 10mg /mL.  Inject 10u/0.69mL/0.25 mg subcu weekly x4 weeks then 20u/0.9mL/0.5 mg  subcu weekly x4 weeks, then 40u/0.49mL/1 mg subcu weekly x4 weeks then 68u/0.57mL/1.7mg  subcu weekly x4 weeks then 100u/55mL/2.5mg  subcu weekly. Patient not taking: Reported on 01/09/2022 10/30/21   Monica Becton, MD  topiramate (TOPAMAX) 50 MG tablet One half tab p.o. nightly for 1 week then 1 tab p.o. nightly for 1 week then 1 tab p.o. twice daily Patient not taking: Reported on 01/09/2022 08/10/21   Monica Becton, MD     Positive ROS: All other systems have been reviewed and were otherwise negative with the exception of those mentioned in  the HPI and as above.  Physical Exam:  Vitals:   01/23/22 0612  BP: (!) 126/90  Pulse: 94  Resp: 18  Temp: 98.3 F (36.8 C)  SpO2: 98%   General: Alert, no acute distress Cardiovascular: No pedal edema Respiratory: No cyanosis, no use of accessory musculature GI: No organomegaly, abdomen is soft and non-tender Skin: No lesions in the area of chief complaint Neurologic: Sensation intact distally Psychiatric: Patient is competent for consent with normal mood and affect Lymphatic: No axillary or cervical lymphadenopathy  MUSCULOSKELETAL: Left foot with tenderness palpation under the second MTP joint with some varus deviation of the toe toward the hallux.  Some laxity noted.  No obvious hammering of the toe.  Sensation grossly intact.  Foot is warm and well-perfused.  Assessment: Left second MTP plantar plate tear with varus deviation   Plan: Plan for MTP capsulotomy with angular correction and repair of the plantar plate by open reduction.  We discussed the risks, benefits and alternatives of surgery which include but are not limited to wound healing complications, infection, nonunion, malunion, need for further surgery, damage to surrounding structures and continued pain.  They understand there is no guarantees to an acceptable outcome.  After weighing these risks they opted to proceed with surgery.     Terance Hart, MD    01/23/2022 7:21 AM

## 2022-01-23 NOTE — Op Note (Signed)
Virginia May female 36 y.o. 01/23/2022  PreOperative Diagnosis: Left second MTP synovitis Left second MTP plantar plate tear with MTP subluxation Left second MTP angular deformity Metatarsalgia  PostOperative Diagnosis: Same  PROCEDURE: Left second MTP capsulotomy with synovectomy Left second metatarsal Weil osteotomy Open reduction of left second MTP joint with plantar plate reconstruction Left second toe angular correction  SURGEON: Melony Overly, MD  ASSISTANT: Jesse Martinique, PA-C  ANESTHESIA: General LMA with peripheral nerve block  FINDINGS: Tearing of the plantar plate laterally with subluxation and angular deformity of the second MPJ  IMPLANTS: FiberWire suture Arthrex 2.0 millimeter screw  INDICATIONS:36 y.o. female with longstanding left foot pain.  She had some deformity through the MTP joint and varus.  She had continued pain along the plantar aspect of her foot consistent with metatarsalgia.  MRI scan demonstrated plantar plate tear laterally.  Given these findings and her failure to improve with conservative treatment she was indicated for surgery.   Patient understood the risks, benefits and alternatives to surgery which include but are not limited to wound healing complications, infection, nonunion, malunion, need for further surgery as well as damage to surrounding structures. They also understood the potential for continued pain in that there were no guarantees of acceptable outcome After weighing these risks the patient opted to proceed with surgery.  PROCEDURE: Patient was identified in the preoperative holding area.  The left foot was marked by myself.  Consent was signed by myself and the patient.  Block was performed by anesthesia in the preoperative holding area.  Patient was taken to the operative suite and placed supine on the operative table.  General LMA anesthesia was induced without difficulty. Bump was placed under the operative hip and bone  foam was used.  All bony prominences were well padded.  Preoperative antibiotics were given. The extremity was prepped and draped in the usual sterile fashion and surgical timeout was performed.  4 inch Esmarch tourniquet was placed.  We began by making a longitudinal incision overlying the second MTP joint.  Was taken sharply down through skin and subcutaneous tissue.  Blunt dissection was used to mobilize skin flaps and the extensor digitorum longus tendon was identified and mobilized.  Then a Z-lengthening was performed of the extensor tendon.  We are able to gain access to the dorsal aspect of the distal second metatarsal and joint capsule.  A transverse incision was made proximal to the joint through the joint capsule and periosteal tissue.  Then this was elevated off the distal aspect of the second metatarsal.  We are able to gain access to the second MTP joint through this capsulotomy.  There was some synovitis and evidence of bruising of the dorsal aspect of the second metatarsal.  Some of this synovial tissue was removed.  Then the toe was held in a plantarflexed position for Weil osteotomy.  Using a sagittal saw a Weil osteotomy was performed in oblique fashion to the distal aspect of the second metatarsal.  The metatarsal head was allowed to retract proximally.  We then inspected inside the joint.  A Valora Corporal was used to test the integrity of the plantar plate.  There is full-thickness tear along the lateral aspect of the plate for approximately 40% of the plantar aspect of the proximal phalanx.  Then using a 15 blade the remaining portion of the plantar plate was taken down from the proximal phalanx for reconstruction.  The toe was subluxated laterally and dorsally prior to taking down the ligament.  The ligament was fully taken down and the Arthrex plantar plate reconstruction kit was used to reconstruct the plantar plate ligament.  Then using an rondure and a hemostat the plantar aspect of the  proximal phalanx was roughened up and scar tissue was removed to give a good surface for ligament healing.  Using the curved passer the plantar plate was grabbed and the suture was run through the plantar plate in a horizontal mattress suture technique.  This was done twice.  Then K wire was used to drill parallel tunnels in the proximal aspect of the proximal phalanx.  Then the suture material was run through the proximal phalanx from plantar to dorsal.  Then the second MTP joint was held in a reduced position and found to be acceptable.  We then proceeded to fixate the Weil osteotomy.  It was held provisionally with a pointed reduction clamp and a 2.0 millimeter screw was placed across the osteotomy to stabilize it.  We then reduced the second MTP joint a second time and the suture material was tied down on the dorsal aspect of proximal phalanx to reconstruct the plantar plate ligament.  There was some persistent angular deformity through the toe and therefore angular correction was performed.  Using a 2-0 FiberWire suture the lateral capsular tissue was reconstruction and tightened down to allow for angular correction of the MTP joint.  Then a 2-0 Vicryl suture was used for more robust correction was also used to repair the capsular tissue on the medial aspect of the second MTP joint.  Then 2 further stabilize the ligament reconstruction for healing a K wire was placed in a retrograde fashion from the tip of the toe across the MTP joint to stabilize it in a acceptable position.  Then the wound was irrigated with normal saline.  2-0 Vicryl suture was used to repair the extensor tendon.  The skin and subcutaneous tissue was then closed with a 3-0 Monocryl and 3-0 nylon.  Soft dressing was placed.  She was awakened from anesthesia and taken recovery stable condition.  No complications.  She tolerated procedure well.  Counts were correct.   POST OPERATIVE INSTRUCTIONS: Weightbearing as tolerated in  a postoperative boot Follow-up in 2 weeks for suture removal if appropriate Nonweightbearing x-rays Pain to be removed at 6 weeks.  TOURNIQUET TIME: Less than 2 hours  BLOOD LOSS:  Minimal         DRAINS: none         SPECIMEN: none       COMPLICATIONS:  * No complications entered in OR log *         Disposition: PACU - hemodynamically stable.         Condition: stable

## 2022-01-23 NOTE — Discharge Instructions (Signed)
DR. Lucia Gaskins FOOT [facility] ANKLE SURGERY POST-OP INSTRUCTIONS   Pain Management The numbing medicine and your leg will last around 18 hours, take a dose of your pain medicine as soon as you feel it wearing off to avoid rebound pain. Keep your foot elevated above heart level.  Make sure that your heel hangs free ('floats'). Take all prescribed medication as directed. If taking narcotic pain medication you may want to use an over-the-counter stool softener to avoid constipation. You may take over-the-counter NSAIDs (ibuprofen, naproxen, etc.) as well as over-the-counter acetaminophen as directed on the packaging as a supplement for your pain and may also use it to wean away from the prescription medication.  Activity Heel Weight bearing as tolerated in short boot  Keep dressing in place  First Postoperative Visit Your first postop visit will be at least 2 weeks after surgery.  This should be scheduled when you schedule surgery. If you do not have a postoperative visit scheduled please call [phone] to schedule an appointment. At the appointment your incision will be evaluated for suture removal, x-rays will be obtained if necessary.  General Instructions Swelling is very common after foot and ankle surgery.  It often takes 3 months for the foot and ankle to begin to feel comfortable.  Some amount of swelling will persist for 6-12 months. DO NOT change the dressing.  If there is a problem with the dressing (too tight, loose, gets wet, etc.) please contact Dr. Pollie Friar office. DO NOT get the dressing wet.  For showers you can use an over-the-counter cast cover or wrap a washcloth around the top of your dressing and then cover it with a plastic bag and tape it to your leg. DO NOT soak the incision (no tubs, pools, bath, etc.) until you have approval from Dr. Lucia Gaskins.  Contact Dr. Huel Cote office or go to Emergency Room if: Temperature above 101 F. Increasing pain that is unresponsive to pain medication  or elevation Excessive redness or swelling in your foot Dressing problems - excessive bloody drainage, looseness or tightness, or if dressing gets wet Develop pain, swelling, warmth, or discoloration of your calf

## 2022-01-23 NOTE — Anesthesia Procedure Notes (Signed)
Anesthesia Regional Block: Adductor canal block   Pre-Anesthetic Checklist: , timeout performed,  Correct Patient, Correct Site, Correct Laterality,  Correct Procedure, Correct Position, site marked,  Risks and benefits discussed,  Surgical consent,  Pre-op evaluation,  At surgeon's request and post-op pain management  Laterality: Lower and Left  Prep: chloraprep       Needles:  Injection technique: Single-shot  Needle Type: Echogenic Stimulator Needle     Needle Length: 9cm  Needle Gauge: 20   Needle insertion depth: 4 cm   Additional Needles:   Procedures:,,,, ultrasound used (permanent image in chart),,    Narrative:  Start time: 01/23/2022 7:05 AM End time: 01/23/2022 7:13 AM Injection made incrementally with aspirations every 5 mL. Anesthesiologist: Lyn Hollingshead, MD

## 2022-01-23 NOTE — Anesthesia Procedure Notes (Signed)
Procedure Name: LMA Insertion Date/Time: 01/23/2022 7:38 AM  Performed by: Minerva Ends, CRNAPre-anesthesia Checklist: Patient identified, Emergency Drugs available, Suction available and Patient being monitored Patient Re-evaluated:Patient Re-evaluated prior to induction Oxygen Delivery Method: Circle system utilized Preoxygenation: Pre-oxygenation with 100% oxygen Induction Type: IV induction Ventilation: Mask ventilation without difficulty LMA: LMA inserted LMA Size: 4.0 Tube type: Oral Number of attempts: 1 Placement Confirmation: positive ETCO2 and breath sounds checked- equal and bilateral Tube secured with: Tape Dental Injury: Teeth and Oropharynx as per pre-operative assessment

## 2022-01-23 NOTE — Anesthesia Procedure Notes (Signed)
Anesthesia Regional Block: Popliteal block   Pre-Anesthetic Checklist: , timeout performed,  Correct Patient, Correct Site, Correct Laterality,  Correct Procedure, Correct Position, site marked,  Risks and benefits discussed,  Surgical consent,  Pre-op evaluation,  At surgeon's request and post-op pain management  Laterality: Lower and Left  Prep: chloraprep       Needles:  Injection technique: Single-shot  Needle Type: Echogenic Stimulator Needle     Needle Length: 9cm  Needle Gauge: 20   Needle insertion depth: 3 cm   Additional Needles:   Procedures:,,,, ultrasound used (permanent image in chart),,    Narrative:  Start time: 01/23/2022 7:15 AM End time: 01/23/2022 7:21 AM Injection made incrementally with aspirations every 5 mL.  Performed by: Personally  Anesthesiologist: Lyn Hollingshead, MD

## 2022-01-23 NOTE — Anesthesia Postprocedure Evaluation (Signed)
Anesthesia Post Note  Patient: Virginia May  Procedure(s) Performed: LEFT SECOND METATARSOPHALANGEAL JOINT CAPSULOTOMY, SECOND METATARSAL OSTEOTOMY, PLANTAR PLATE RECONSTRUCTION (Left) ANGULAR CORRECTION OF SECOND TOE (Left: Toe)     Patient location during evaluation: Phase II Anesthesia Type: Regional and General Level of consciousness: awake Pain management: pain level controlled Vital Signs Assessment: post-procedure vital signs reviewed and stable Respiratory status: spontaneous breathing Cardiovascular status: stable Postop Assessment: no apparent nausea or vomiting Anesthetic complications: no   No notable events documented.  Last Vitals:  Vitals:   01/23/22 0925 01/23/22 0940  BP: 109/70 117/71  Pulse: 89 84  Resp: 15 13  Temp:  (!) 36.4 C  SpO2: 92% 96%    Last Pain:  Vitals:   01/23/22 0940  TempSrc:   PainSc: 0-No pain                 Huston Foley

## 2022-01-24 ENCOUNTER — Encounter (HOSPITAL_COMMUNITY): Payer: Self-pay | Admitting: Orthopaedic Surgery

## 2022-02-07 DIAGNOSIS — M7742 Metatarsalgia, left foot: Secondary | ICD-10-CM | POA: Diagnosis not present

## 2022-02-07 DIAGNOSIS — Z9889 Other specified postprocedural states: Secondary | ICD-10-CM | POA: Diagnosis not present

## 2022-03-07 DIAGNOSIS — M7742 Metatarsalgia, left foot: Secondary | ICD-10-CM | POA: Diagnosis not present

## 2022-03-27 DIAGNOSIS — Z30431 Encounter for routine checking of intrauterine contraceptive device: Secondary | ICD-10-CM | POA: Diagnosis not present

## 2022-04-04 DIAGNOSIS — M7742 Metatarsalgia, left foot: Secondary | ICD-10-CM | POA: Diagnosis not present

## 2022-04-21 ENCOUNTER — Telehealth: Payer: 59 | Admitting: Nurse Practitioner

## 2022-04-21 DIAGNOSIS — B379 Candidiasis, unspecified: Secondary | ICD-10-CM | POA: Diagnosis not present

## 2022-04-21 DIAGNOSIS — J019 Acute sinusitis, unspecified: Secondary | ICD-10-CM | POA: Diagnosis not present

## 2022-04-21 DIAGNOSIS — B9689 Other specified bacterial agents as the cause of diseases classified elsewhere: Secondary | ICD-10-CM

## 2022-04-21 DIAGNOSIS — T3695XA Adverse effect of unspecified systemic antibiotic, initial encounter: Secondary | ICD-10-CM | POA: Diagnosis not present

## 2022-04-21 MED ORDER — AMOXICILLIN-POT CLAVULANATE 875-125 MG PO TABS: 1.0000 | ORAL_TABLET | Freq: Two times a day (BID) | ORAL | 0 refills | Status: DC

## 2022-04-21 MED ORDER — FLUCONAZOLE 150 MG PO TABS: 150.0000 mg | ORAL_TABLET | ORAL | 0 refills | Status: DC

## 2022-04-21 NOTE — Progress Notes (Signed)
Virtual Visit Consent   Virginia May, you are scheduled for a virtual visit with a [facility] provider today. Just as with appointments in the office, your consent must be obtained to participate. Your consent will be active for this visit and any virtual visit you may have with one of our providers in the next 365 days. If you have a MyChart account, a copy of this consent can be sent to you electronically.  As this is a virtual visit, video technology does not allow for your provider to perform a traditional examination. This may limit your provider's ability to fully assess your condition. If your provider identifies any concerns that need to be evaluated in person or the need to arrange testing (such as labs, EKG, etc.), we will make arrangements to do so. Although advances in technology are sophisticated, we cannot ensure that it will always work on either your end or our end. If the connection with a video visit is poor, the visit may have to be switched to a telephone visit. With either a video or telephone visit, we are not always able to ensure that we have a secure connection.  By engaging in this virtual visit, you consent to the provision of healthcare and authorize for your insurance to be billed (if applicable) for the services provided during this visit. Depending on your insurance coverage, you may receive a charge related to this service.  I need to obtain your verbal consent now. Are you willing to proceed with your visit today? Virginia May has provided verbal consent on 04/21/2022 for a virtual visit (video or telephone). Virginia Pounds, NP  Date: 04/21/2022 2:29 PM  Virtual Visit via Video Note   I, Virginia May, connected with  Virginia May  ([id number], [birth year]) on 04/21/22 at  2:30 PM EST by a video-enabled telemedicine application and verified that I am speaking with the correct person using two identifiers.  Location: Patient: Virtual Visit Location Patient:  Home Provider: Virtual Visit Location Provider: Home Office   I discussed the limitations of evaluation and management by telemedicine and the availability of in person appointments. The patient expressed understanding and agreed to proceed.    History of Present Illness: Virginia May is a 37 y.o. who identifies as a female who was assigned female at birth, and is being seen today for sinusitis.  Virginia May has been experiencing the following symptoms with no improvement greater than one week: Sinus pressure , lightheadedness and dizziness, cough, sneezing, nasal congestion. She has tried sudafed sinus OTC. Denies fever. Current symptoms are similar to previous sinus infection for which she was prescribed antibiotics in the past.    Problems:  Patient Active Problem List   Diagnosis Date Noted   Hypothyroid 06/07/2021   Iron deficiency 06/07/2021   Primary insomnia 06/07/2021   Annual physical exam 06/07/2021   Seasonal allergic rhinitis 07/21/2018   Primary osteoarthritis of right foot 10/16/2017   Obesity 10/28/2014   Asthma, moderate persistent 10/28/2014    Allergies:  Allergies  Allergen Reactions   Ibuprofen Shortness Of Breath   Medications:  Current Outpatient Medications:    albuterol (VENTOLIN HFA) 108 (90 Base) MCG/ACT inhaler, INHALE 2 PUFFS INTO THE LUNGS EVERY 6 HOURS AS NEEDED FOR WHEEZE, Disp: 13.4 each, Rfl: 3   amoxicillin-clavulanate (AUGMENTIN) 875-125 MG tablet, Take 1 tablet by mouth 2 (two) times daily., Disp: 20 tablet, Rfl: 0   budesonide-formoterol (SYMBICORT) 160-4.5 MCG/ACT inhaler, Inhale 1 puff  into the lungs 2 (two) times daily., Disp: 1 each, Rfl: 3   cetirizine (ZYRTEC) 10 MG tablet, Take 1 tablet (10 mg total) by mouth daily., Disp: 90 tablet, Rfl: 3   fluconazole (DIFLUCAN) 150 MG tablet, Take 1 tablet (150 mg total) by mouth as directed. Repeat X1 after 72 hours as needed, Disp: 2 tablet, Rfl: 0   montelukast (SINGULAIR) 10 MG tablet, Take 1  tablet (10 mg total) by mouth at bedtime. (Patient not taking: Reported on 01/09/2022), Disp: 90 tablet, Rfl: 3   phentermine (ADIPEX-P) 37.5 MG tablet, One tab by mouth qAM (Patient not taking: Reported on 01/09/2022), Disp: 30 tablet, Rfl: 0   Semaglutide, 2 MG/DOSE, 8 MG/3ML SOPN, Semaglutide 2.5 mg/mL + VIt B6 10mg /mL.  Inject 10u/0.52mL/0.25 mg subcu weekly x4 weeks then 20u/0.85mL/0.5 mg subcu weekly x4 weeks, then 40u/0.3mL/1 mg subcu weekly x4 weeks then 68u/0.80mL/1.7mg  subcu weekly x4 weeks then 100u/4mL/2.5mg  subcu weekly. (Patient not taking: Reported on 01/09/2022), Disp: 2 mL, Rfl: 3   topiramate (TOPAMAX) 50 MG tablet, One half tab p.o. nightly for 1 week then 1 tab p.o. nightly for 1 week then 1 tab p.o. twice daily (Patient not taking: Reported on 01/09/2022), Disp: 60 tablet, Rfl: 0  Observations/Objective: Patient is well-developed, well-nourished in no acute distress.  Resting comfortably  at home.  Head is normocephalic, atraumatic.  No labored breathing.  Speech is clear and coherent with logical content.  Patient is alert and oriented at baseline.    Assessment and Plan: 1. Acute bacterial sinusitis - amoxicillin-clavulanate (AUGMENTIN) 875-125 MG tablet; Take 1 tablet by mouth 2 (two) times daily.  Dispense: 20 tablet; Refill: 0  2. Antibiotic-induced yeast infection - fluconazole (DIFLUCAN) 150 MG tablet; Take 1 tablet (150 mg total) by mouth as directed. Repeat X1 after 72 hours as needed  Dispense: 2 tablet; Refill: 0  May use OTC steroid nasal spray or nasal rinse for sinus symptoms.   Follow Up Instructions: I discussed the assessment and treatment plan with the patient. The patient was provided an opportunity to ask questions and all were answered. The patient agreed with the plan and demonstrated an understanding of the instructions.  A copy of instructions were sent to the patient via MyChart unless otherwise noted below.     The patient was advised to call back  or seek an in-person evaluation if the symptoms worsen or if the condition fails to improve as anticipated.  Time:  I spent 11 minutes with the patient via telehealth technology discussing the above problems/concerns.    01/11/2022, NP

## 2022-04-21 NOTE — Patient Instructions (Signed)
Virginia May, thank you for joining Gildardo Pounds, NP for today's virtual visit.  While this provider is not your primary care provider (PCP), if your PCP is located in our provider database this encounter information will be shared with them immediately following your visit.   [facility] account gives you access to today's visit and all your visits, tests, and labs performed at [facility] " click here if you don't have a [facility] account or go to [email or web address]  Consent: (Patient) Virginia May provided verbal consent for this virtual visit at the beginning of the encounter.  Current Medications:  Current Outpatient Medications:    albuterol (VENTOLIN HFA) 108 (90 Base) MCG/ACT inhaler, INHALE 2 PUFFS INTO THE LUNGS EVERY 6 HOURS AS NEEDED FOR WHEEZE, Disp: 13.4 each, Rfl: 3   amoxicillin-clavulanate (AUGMENTIN) 875-125 MG tablet, Take 1 tablet by mouth 2 (two) times daily., Disp: 20 tablet, Rfl: 0   budesonide-formoterol (SYMBICORT) 160-4.5 MCG/ACT inhaler, Inhale 1 puff into the lungs 2 (two) times daily., Disp: 1 each, Rfl: 3   cetirizine (ZYRTEC) 10 MG tablet, Take 1 tablet (10 mg total) by mouth daily., Disp: 90 tablet, Rfl: 3   fluconazole (DIFLUCAN) 150 MG tablet, Take 1 tablet (150 mg total) by mouth as directed. Repeat X1 after 72 hours as needed, Disp: 2 tablet, Rfl: 0   montelukast (SINGULAIR) 10 MG tablet, Take 1 tablet (10 mg total) by mouth at bedtime. (Patient not taking: Reported on 01/09/2022), Disp: 90 tablet, Rfl: 3   phentermine (ADIPEX-P) 37.5 MG tablet, One tab by mouth qAM (Patient not taking: Reported on 01/09/2022), Disp: 30 tablet, Rfl: 0   Semaglutide, 2 MG/DOSE, 8 MG/3ML SOPN, Semaglutide 2.5 mg/mL + VIt B6 10mg /mL.  Inject 10u/0.88mL/0.25 mg subcu weekly x4 weeks then 20u/0.10mL/0.5 mg subcu weekly x4 weeks, then 40u/0.72mL/1 mg subcu weekly x4 weeks then 68u/0.45mL/1.7mg  subcu weekly x4 weeks then 100u/48mL/2.5mg  subcu  weekly. (Patient not taking: Reported on 01/09/2022), Disp: 2 mL, Rfl: 3   topiramate (TOPAMAX) 50 MG tablet, One half tab p.o. nightly for 1 week then 1 tab p.o. nightly for 1 week then 1 tab p.o. twice daily (Patient not taking: Reported on 01/09/2022), Disp: 60 tablet, Rfl: 0   Medications ordered in this encounter:  Meds ordered this encounter  Medications   fluconazole (DIFLUCAN) 150 MG tablet    Sig: Take 1 tablet (150 mg total) by mouth as directed. Repeat X1 after 72 hours as needed    Dispense:  2 tablet    Refill:  0    Order Specific Question:   Supervising Provider    Answer:   Chase Picket [[id number]]   amoxicillin-clavulanate (AUGMENTIN) 875-125 MG tablet    Sig: Take 1 tablet by mouth 2 (two) times daily.    Dispense:  20 tablet    Refill:  0    Order Specific Question:   Supervising Provider    Answer:   Chase Picket [id number]     *If you need refills on other medications prior to your next appointment, please contact your pharmacy*  Follow-Up: Call back or seek an in-person evaluation if the symptoms worsen or if the condition fails to improve as anticipated.  [place] [phone]  Other Instructions May use OTC steroid nasal spray or nasal rinse for sinus symptoms.    If you have been instructed to have an in-person evaluation today at a local Urgent Care facility, please use the  link below. It will take you to a list of all of our available [facility], including address, phone number and hours of operation. Please do not delay care.  [facility]  If you or a family member do not have a primary care provider, use the link below to schedule a visit and establish care. When you choose a [facility] primary care physician or advanced practice provider, you gain a long-term partner in health. Find a Primary Care Provider  Learn more about [facility]'s in-office and virtual care options: [facility] - Get Care Now

## 2022-05-14 ENCOUNTER — Telehealth: Payer: Self-pay | Admitting: Sports Medicine

## 2022-05-14 NOTE — Telephone Encounter (Signed)
Absolutely fine if okay with Dr. Huel Cote.

## 2022-05-14 NOTE — Telephone Encounter (Signed)
Patient requesting transfer of care from Dr. Darene Lamer to Dr. Huel Cote, cites she is "just looking for someone new". Please advise. Virginia May

## 2022-05-14 NOTE — Telephone Encounter (Signed)
Patient made aware. Rescheduled previous New Patient appointment with Dr. Huel Cote, made not of approved TOC. Will change PCP in EPIC at time of visit. Virginia May

## 2022-05-16 ENCOUNTER — Encounter: Payer: Self-pay | Admitting: Family Medicine

## 2022-05-16 ENCOUNTER — Ambulatory Visit: Payer: 59 | Admitting: Family Medicine

## 2022-05-16 ENCOUNTER — Ambulatory Visit (INDEPENDENT_AMBULATORY_CARE_PROVIDER_SITE_OTHER): Payer: 59 | Admitting: Family Medicine

## 2022-05-16 VITALS — BP 136/83 | HR 97 | Temp 97.5°F | Ht 63.0 in | Wt 301.1 lb

## 2022-05-16 DIAGNOSIS — Z7689 Persons encountering health services in other specified circumstances: Secondary | ICD-10-CM

## 2022-05-16 DIAGNOSIS — Z6841 Body Mass Index (BMI) 40.0 and over, adult: Secondary | ICD-10-CM | POA: Diagnosis not present

## 2022-05-16 DIAGNOSIS — R5382 Chronic fatigue, unspecified: Secondary | ICD-10-CM | POA: Diagnosis not present

## 2022-05-16 DIAGNOSIS — Z1159 Encounter for screening for other viral diseases: Secondary | ICD-10-CM | POA: Diagnosis not present

## 2022-05-16 DIAGNOSIS — J302 Other seasonal allergic rhinitis: Secondary | ICD-10-CM | POA: Diagnosis not present

## 2022-05-16 DIAGNOSIS — Z13220 Encounter for screening for lipoid disorders: Secondary | ICD-10-CM

## 2022-05-16 DIAGNOSIS — Z136 Encounter for screening for cardiovascular disorders: Secondary | ICD-10-CM

## 2022-05-16 DIAGNOSIS — Z0000 Encounter for general adult medical examination without abnormal findings: Secondary | ICD-10-CM

## 2022-05-16 DIAGNOSIS — R7302 Impaired glucose tolerance (oral): Secondary | ICD-10-CM

## 2022-05-16 MED ORDER — FLUTICASONE PROPIONATE 50 MCG/ACT NA SUSP: 1.0000 | Freq: Every day | NASAL | 6 refills | Status: AC

## 2022-05-16 NOTE — Progress Notes (Signed)
Complete physical exam and establish care  Patient: Virginia May   DOB: [birth year]   37 y.o. Female  MRN: [id number]  Subjective:    Chief Complaint  Patient presents with   New Patient (Initial Visit)    Patient in office to est PCP ( transfer from Dr. Dianah Field) - want to discuss weight loss options - also requesting annual exam today if possible     Virginia May is a 37 y.o. female who presents today for a complete physical exam. She reports consuming a  keto  diet. Home exercise routine includes treadmill. She generally feels well. She reports sleeping well. She does have additional problems to discuss today.  Pt would like to see healthy weight and wellness for weight loss options She has asthma and on Symbicort BID along with Albuterol inhaler prn, zyrtec and singulair.   Most recent fall risk assessment:    05/16/2022    9:22 AM  Fall Risk   Falls in the past year? 0  Number falls in past yr: 0  Injury with Fall? 0  Risk for fall due to : No Fall Risks  Follow up Falls evaluation completed     Most recent depression screenings:    05/16/2022    9:22 AM 06/07/2021   10:40 AM  PHQ 2/9 Scores  PHQ - 2 Score 0 0  PHQ- 9 Score 1     Vision:Within last year and Dental: No current dental problems  Patient Active Problem List   Diagnosis Date Noted   Abnormal uterine bleeding 06/20/2021   Hypothyroid 06/07/2021   Iron deficiency 06/07/2021   Primary insomnia 06/07/2021   Annual physical exam 06/07/2021   Seasonal allergic rhinitis 07/21/2018   Primary osteoarthritis of right foot 10/16/2017   Obesity 10/28/2014   Asthma, moderate persistent 10/28/2014   Past Medical History:  Diagnosis Date   Asthma, moderate persistent 10/28/2014   Headache    Obesity 10/28/2014   PONV (postoperative nausea and vomiting)    Tibia/fibula fracture r   Social History   Tobacco Use   Smoking status: Never   Smokeless tobacco: Never  Vaping Use   Vaping Use: Never  used  Substance Use Topics   Alcohol use: No   Drug use: No   Social History   Socioeconomic History   Marital status: Single    Spouse name: Not on file   Number of children: Not on file   Years of education: Not on file   Highest education level: Not on file  Occupational History   Occupation: Solicitor  Tobacco Use   Smoking status: Never   Smokeless tobacco: Never  Vaping Use   Vaping Use: Never used  Substance and Sexual Activity   Alcohol use: No   Drug use: No   Sexual activity: Not Currently    Birth control/protection: I.U.D.  Other Topics Concern   Not on file  Social History Narrative   Not on file   Social Determinants of Health   Financial Resource Strain: Not on file  Food Insecurity: Not on file  Transportation Needs: Not on file  Physical Activity: Not on file  Stress: Not on file  Social Connections: Not on file  Intimate Partner Violence: Not on file      Patient Care Team: Leeanne Rio, MD as PCP - General (Family Medicine)   Outpatient Medications Prior to Visit  Medication Sig   albuterol (VENTOLIN HFA) 108 (90 Base) MCG/ACT inhaler INHALE  2 PUFFS INTO THE LUNGS EVERY 6 HOURS AS NEEDED FOR WHEEZE   budesonide-formoterol (SYMBICORT) 160-4.5 MCG/ACT inhaler Inhale 1 puff into the lungs 2 (two) times daily.   cetirizine (ZYRTEC) 10 MG tablet Take 1 tablet (10 mg total) by mouth daily.   levonorgestrel (MIRENA, 52 MG,) 20 MCG/DAY IUD 1 each by Intrauterine route once.   montelukast (SINGULAIR) 10 MG tablet Take 1 tablet (10 mg total) by mouth at bedtime.   [DISCONTINUED] amoxicillin-clavulanate (AUGMENTIN) 875-125 MG tablet Take 1 tablet by mouth 2 (two) times daily.   [DISCONTINUED] fluconazole (DIFLUCAN) 150 MG tablet Take 1 tablet (150 mg total) by mouth as directed. Repeat X1 after 72 hours as needed   [DISCONTINUED] phentermine (ADIPEX-P) 37.5 MG tablet One tab by mouth qAM (Patient not taking: Reported on 01/09/2022)    [DISCONTINUED] Semaglutide, 2 MG/DOSE, 8 MG/3ML SOPN Semaglutide 2.5 mg/mL + VIt B6 10mg /mL.  Inject 10u/0.83mL/0.25 mg subcu weekly x4 weeks then 20u/0.61mL/0.5 mg subcu weekly x4 weeks, then 40u/0.33mL/1 mg subcu weekly x4 weeks then 68u/0.72mL/1.7mg  subcu weekly x4 weeks then 100u/45mL/2.5mg  subcu weekly. (Patient not taking: Reported on 01/09/2022)   [DISCONTINUED] topiramate (TOPAMAX) 50 MG tablet One half tab p.o. nightly for 1 week then 1 tab p.o. nightly for 1 week then 1 tab p.o. twice daily (Patient not taking: Reported on 01/09/2022)   No facility-administered medications prior to visit.    Review of Systems  Constitutional:  Positive for malaise/fatigue.  All other systems reviewed and are negative.        Objective:     BP 136/83   Pulse 97   Temp (!) 97.5 F (36.4 C)   Ht 5\' 3"  (1.6 m)   Wt (!) 301 lb 2 oz (136.6 kg)   SpO2 96%   BMI 53.34 kg/m  BP Readings from Last 3 Encounters:  05/16/22 136/83  01/23/22 117/71  01/15/22 (!) 124/91      Physical Exam Vitals and nursing note reviewed.  Constitutional:      Appearance: She is obese.  HENT:     Head: Normocephalic and atraumatic.     Right Ear: Tympanic membrane, ear canal and external ear normal.     Left Ear: Ear canal and external ear normal.     Nose: Nose normal.     Mouth/Throat:     Mouth: Mucous membranes are moist.  Eyes:     Conjunctiva/sclera: Conjunctivae normal.     Pupils: Pupils are equal, round, and reactive to light.  Cardiovascular:     Rate and Rhythm: Normal rate and regular rhythm.     Pulses: Normal pulses.     Heart sounds: Normal heart sounds.  Pulmonary:     Effort: Pulmonary effort is normal.     Breath sounds: Normal breath sounds.  Abdominal:     General: Abdomen is flat. Bowel sounds are normal.  Skin:    General: Skin is warm.     Capillary Refill: Capillary refill takes less than 2 seconds.  Neurological:     General: No focal deficit present.     Mental Status: She  is alert and oriented to person, place, and time. Mental status is at baseline.  Psychiatric:        Mood and Affect: Mood normal.        Behavior: Behavior normal.        Thought Content: Thought content normal.        Judgment: Judgment normal.     No results found  for any visits on 05/16/22. Last CBC Lab Results  Component Value Date   WBC 5.4 01/15/2022   HGB 13.7 01/15/2022   HCT 42.1 01/15/2022   MCV 81.3 01/15/2022   MCH 26.4 01/15/2022   RDW 14.0 01/15/2022   PLT 217 78/46/9629   Last metabolic panel Lab Results  Component Value Date   GLUCOSE 86 06/07/2021   NA 140 06/07/2021   K 4.3 06/07/2021   CL 107 06/07/2021   CO2 26 06/07/2021   BUN 14 06/07/2021   CREATININE 0.64 06/07/2021   GFRNONAA 119 08/25/2018   CALCIUM 9.4 06/07/2021   PROT 6.8 06/07/2021   ALBUMIN 4.3 10/28/2014   BILITOT 0.4 06/07/2021   ALKPHOS 69 10/28/2014   AST 17 06/07/2021   ALT 12 06/07/2021   ANIONGAP 8 04/07/2018   Last lipids Lab Results  Component Value Date   CHOL 160 06/07/2021   HDL 50 06/07/2021   LDLCALC 95 06/07/2021   TRIG 66 06/07/2021   CHOLHDL 3.2 06/07/2021   Last hemoglobin A1c Lab Results  Component Value Date   HGBA1C 5.5 06/07/2021        Assessment & Plan:    Routine Health Maintenance and Physical Exam  Immunization History  Administered Date(s) Administered   Influenza,inj,Quad PF,6+ Mos 12/24/2016   Influenza-Unspecified 02/14/2018   PPD Test 12/22/2016   Tdap 08/21/2017    Health Maintenance  Topic Date Due   Hepatitis C Screening  Never done   INFLUENZA VACCINE  07/15/2022 (Originally 11/14/2021)   PAP SMEAR-Modifier  06/28/2024   DTaP/Tdap/Td (2 - Td or Tdap) 08/22/2027   HIV Screening  Completed   HPV VACCINES  Aged Out   COVID-19 Vaccine  Discontinued    Discussed health benefits of physical activity, and encouraged her to engage in regular exercise appropriate for her age and condition.  Problem List Items Addressed This Visit    None  No follow-ups on file. Screening labs today for annual exam Pap due next year Refer to healthy weight and wellness for weight loss options.    Leeanne Rio, MD

## 2022-05-17 ENCOUNTER — Encounter: Payer: Self-pay | Admitting: Bariatrics

## 2022-05-17 ENCOUNTER — Other Ambulatory Visit: Payer: Self-pay | Admitting: Family Medicine

## 2022-05-17 ENCOUNTER — Ambulatory Visit: Payer: 59 | Admitting: Bariatrics

## 2022-05-17 VITALS — BP 138/91 | HR 89 | Temp 98.3°F | Ht 63.0 in | Wt 296.0 lb

## 2022-05-17 DIAGNOSIS — E559 Vitamin D deficiency, unspecified: Secondary | ICD-10-CM

## 2022-05-17 DIAGNOSIS — E1169 Type 2 diabetes mellitus with other specified complication: Secondary | ICD-10-CM

## 2022-05-17 DIAGNOSIS — Z0289 Encounter for other administrative examinations: Secondary | ICD-10-CM

## 2022-05-17 DIAGNOSIS — R5383 Other fatigue: Secondary | ICD-10-CM

## 2022-05-17 DIAGNOSIS — Z6841 Body Mass Index (BMI) 40.0 and over, adult: Secondary | ICD-10-CM

## 2022-05-17 DIAGNOSIS — E038 Other specified hypothyroidism: Secondary | ICD-10-CM | POA: Diagnosis not present

## 2022-05-17 DIAGNOSIS — E65 Localized adiposity: Secondary | ICD-10-CM | POA: Insufficient documentation

## 2022-05-17 DIAGNOSIS — R030 Elevated blood-pressure reading, without diagnosis of hypertension: Secondary | ICD-10-CM

## 2022-05-17 LAB — CBC WITH DIFFERENTIAL/PLATELET
Basophils Absolute: 0.1 10*3/uL (ref 0.0–0.2)
Basos: 1 %
EOS (ABSOLUTE): 0.2 10*3/uL (ref 0.0–0.4)
Eos: 3 %
Hematocrit: 43.9 % (ref 34.0–46.6)
Hemoglobin: 14.3 g/dL (ref 11.1–15.9)
Immature Grans (Abs): 0 10*3/uL (ref 0.0–0.1)
Immature Granulocytes: 0 %
Lymphocytes Absolute: 1.4 10*3/uL (ref 0.7–3.1)
Lymphs: 25 %
MCH: 25.6 pg — ABNORMAL LOW (ref 26.6–33.0)
MCHC: 32.6 g/dL (ref 31.5–35.7)
MCV: 79 fL (ref 79–97)
Monocytes Absolute: 0.4 10*3/uL (ref 0.1–0.9)
Monocytes: 6 %
Neutrophils Absolute: 3.7 10*3/uL (ref 1.4–7.0)
Neutrophils: 65 %
Platelets: 262 10*3/uL (ref 150–450)
RBC: 5.58 x10E6/uL — ABNORMAL HIGH (ref 3.77–5.28)
RDW: 13.8 % (ref 11.7–15.4)
WBC: 5.8 10*3/uL (ref 3.4–10.8)

## 2022-05-17 LAB — TSH: TSH: 2.65 u[IU]/mL (ref 0.450–4.500)

## 2022-05-17 LAB — COMPREHENSIVE METABOLIC PANEL
ALT: 17 IU/L (ref 0–32)
AST: 17 IU/L (ref 0–40)
Albumin/Globulin Ratio: 1.8 (ref 1.2–2.2)
Albumin: 4.4 g/dL (ref 3.9–4.9)
Alkaline Phosphatase: 84 IU/L (ref 44–121)
BUN/Creatinine Ratio: 19 (ref 9–23)
BUN: 12 mg/dL (ref 6–20)
Bilirubin Total: 0.3 mg/dL (ref 0.0–1.2)
CO2: 19 mmol/L — ABNORMAL LOW (ref 20–29)
Calcium: 9.4 mg/dL (ref 8.7–10.2)
Chloride: 103 mmol/L (ref 96–106)
Creatinine, Ser: 0.62 mg/dL (ref 0.57–1.00)
Globulin, Total: 2.5 g/dL (ref 1.5–4.5)
Glucose: 99 mg/dL (ref 70–99)
Potassium: 4.5 mmol/L (ref 3.5–5.2)
Sodium: 138 mmol/L (ref 134–144)
Total Protein: 6.9 g/dL (ref 6.0–8.5)
eGFR: 120 mL/min/{1.73_m2} (ref >59)

## 2022-05-17 LAB — LIPID PANEL
Chol/HDL Ratio: 4 ratio (ref 0.0–4.4)
Cholesterol, Total: 174 mg/dL (ref 100–199)
HDL: 43 mg/dL (ref >39)
LDL Chol Calc (NIH): 108 mg/dL — ABNORMAL HIGH (ref 0–99)
Triglycerides: 131 mg/dL (ref 0–149)
VLDL Cholesterol Cal: 23 mg/dL (ref 5–40)

## 2022-05-17 LAB — HEMOGLOBIN A1C
Est. average glucose Bld gHb Est-mCnc: 140 mg/dL
Hgb A1c MFr Bld: 6.5 % — ABNORMAL HIGH (ref 4.8–5.6)

## 2022-05-17 LAB — HEPATITIS C ANTIBODY: Hep C Virus Ab: NONREACTIVE

## 2022-05-17 LAB — T4, FREE: Free T4: 1.14 ng/dL (ref 0.82–1.77)

## 2022-05-17 LAB — VITAMIN B12: Vitamin B-12: 318 pg/mL (ref 232–1245)

## 2022-05-17 LAB — VITAMIN D 25 HYDROXY (VIT D DEFICIENCY, FRACTURES): Vit D, 25-Hydroxy: 14.4 ng/mL — ABNORMAL LOW (ref 30.0–100.0)

## 2022-05-17 MED ORDER — VITAMIN D (ERGOCALCIFEROL) 1.25 MG (50000 UNIT) PO CAPS: 50000.0000 [IU] | ORAL_CAPSULE | ORAL | 3 refills | Status: AC

## 2022-05-25 DIAGNOSIS — J018 Other acute sinusitis: Secondary | ICD-10-CM | POA: Diagnosis not present

## 2022-05-25 DIAGNOSIS — Z20822 Contact with and (suspected) exposure to covid-19: Secondary | ICD-10-CM | POA: Diagnosis not present

## 2022-05-25 DIAGNOSIS — R030 Elevated blood-pressure reading, without diagnosis of hypertension: Secondary | ICD-10-CM | POA: Diagnosis not present

## 2022-05-25 DIAGNOSIS — Z6841 Body Mass Index (BMI) 40.0 and over, adult: Secondary | ICD-10-CM | POA: Diagnosis not present

## 2022-05-31 NOTE — Progress Notes (Addendum)
Office: [phone]  /  Fax: [phone]  Initial Visit  Virginia May was seen in clinic today to evaluate for obesity. She is interested in losing weight to improve overall health and reduce the risk of weight related complications. She presents today to review program treatment options, initial physical assessment, and evaluation.     She was referred by: PCP  When asked what else they would they like to accomplish? She states: Improve existing medical conditions and Improve quality of life  When asked how has your weight affected you? She states: Contributed to medical problems, Having fatigue, and Having poor endurance  Some associated conditions: Diabetes  Contributing factors: Family history  Weight promoting medications identified: Steroids  Current nutrition plan: None and Portion control / smart choices  Current level of physical activity: Walking  Current or previous pharmacotherapy: None  Response to medication: Never tried medications   Past medical history includes:   Past Medical History:  Diagnosis Date   Asthma, moderate persistent 10/28/2014   Headache    Obesity 10/28/2014   PONV (postoperative nausea and vomiting)    Tibia/fibula fracture r     Objective:   BP (!) 138/91   Pulse 89   Temp 98.3 F (36.8 C)   Ht 5' 3"$  (1.6 m)   Wt 296 lb (134.3 kg)   SpO2 97%   BMI 52.43 kg/m  She was weighed on the bioimpedance scale: Body mass index is 52.43 kg/m.  Visceral Fat Rating: 17, Body Fat%: 50.8  General:  Alert, oriented and cooperative. Patient is in no acute distress.  Respiratory: Normal respiratory effort, no problems with respiration noted  Extremities: Normal range of motion.    Mental Status: Normal mood and affect. Normal behavior. Normal judgment and thought content.   Assessment and Plan:  1. Visceral obesity Visceral fat rating 17  Plan:  Goal is <13 Will work on plan and exercise  2. Other specified hypothyroidism Virginia May  is not on medication. Had Thyroid panel- within normal limits  Plan: Will follow out plan.  3. Fatigue, unspecified type Pt reports fatigue with certain activities.  Plan: Will do EKG and IC at next visit. - EKG 12-Lead; Future  4. Elevated blood pressure reading Virginia May is not on medication.  5. Diabetes mellitus type 2 in obese [facility]) Recent diagnosis. Family history- maternal and paternal  Plan: Handouts for GLP-1 and Metformin  6. Morbid obesity ([place]) 7. BMI 50.0-59.9, adult ([place]) Review labs 05/16/2022 CMP, lipids, Vitamin D, A1c    Obesity Treatment Plan:  She will work on garnering support from family and friends to begin weight loss journey. Work on eliminating or reducing the presence of highly processed, calorie dense foods in the home. Complete provided nutritional and psychosocial assessment questionnaire prior to her next visit.  Assess for cardiometabolic complications and nutritional deficiencies via fasting serologies.  Assess REE via indirect calorimetry to guide the creation of a reduced calorie, high protein meal plan to promote loss of fat mass.   She will think about ideas on how to incorporate physical activity in their daily routine.  Virginia May will follow up in the next 1-2 weeks to review the above steps and continue evaluation and treatment.  Obesity Education Performed Today:  She was weighed on the bioimpedance scale and results were discussed and documented in the synopsis.  We discussed obesity as a disease and the importance of a more detailed evaluation of all the factors contributing to the disease.  We discussed the importance of long term lifestyle changes which include nutrition, exercise and behavioral modifications as well as the importance of customizing this to her specific health and social needs.  We discussed the benefits of reaching a healthier weight to alleviate the symptoms of existing conditions and reduce the risks of the  biomechanical, metabolic and psychological effects of obesity.  Virginia May appears to be in the action stage of change and states they are ready to start intensive lifestyle modifications and behavioral modifications.   I, Virginia May, BS, CMA, am acting as transcriptionist for [employer], DO.  Virginia Lesch, DO

## 2022-06-04 ENCOUNTER — Encounter: Payer: Self-pay | Admitting: Bariatrics

## 2022-06-06 ENCOUNTER — Encounter: Payer: 59 | Admitting: Nurse Practitioner

## 2022-06-11 ENCOUNTER — Encounter: Payer: Self-pay | Admitting: Bariatrics

## 2022-06-11 ENCOUNTER — Ambulatory Visit (INDEPENDENT_AMBULATORY_CARE_PROVIDER_SITE_OTHER): Payer: 59 | Admitting: Bariatrics

## 2022-06-11 VITALS — BP 127/80 | HR 100 | Temp 98.1°F | Ht 63.0 in | Wt 294.0 lb

## 2022-06-11 DIAGNOSIS — Z1331 Encounter for screening for depression: Secondary | ICD-10-CM | POA: Diagnosis not present

## 2022-06-11 DIAGNOSIS — E1169 Type 2 diabetes mellitus with other specified complication: Secondary | ICD-10-CM

## 2022-06-11 DIAGNOSIS — Z7984 Long term (current) use of oral hypoglycemic drugs: Secondary | ICD-10-CM | POA: Diagnosis not present

## 2022-06-11 DIAGNOSIS — Z0000 Encounter for general adult medical examination without abnormal findings: Secondary | ICD-10-CM | POA: Diagnosis not present

## 2022-06-11 DIAGNOSIS — E65 Localized adiposity: Secondary | ICD-10-CM

## 2022-06-11 DIAGNOSIS — E669 Obesity, unspecified: Secondary | ICD-10-CM

## 2022-06-11 DIAGNOSIS — R5383 Other fatigue: Secondary | ICD-10-CM

## 2022-06-11 DIAGNOSIS — Z6841 Body Mass Index (BMI) 40.0 and over, adult: Secondary | ICD-10-CM

## 2022-06-11 DIAGNOSIS — R0602 Shortness of breath: Secondary | ICD-10-CM

## 2022-06-11 DIAGNOSIS — E559 Vitamin D deficiency, unspecified: Secondary | ICD-10-CM

## 2022-06-11 MED ORDER — METFORMIN HCL 500 MG PO TABS: 500.0000 mg | ORAL_TABLET | Freq: Every day | ORAL | 0 refills | Status: DC

## 2022-06-12 LAB — COMPREHENSIVE METABOLIC PANEL
ALT: 22 IU/L (ref 0–32)
AST: 21 IU/L (ref 0–40)
Albumin/Globulin Ratio: 2 (ref 1.2–2.2)
Albumin: 4.5 g/dL (ref 3.9–4.9)
Alkaline Phosphatase: 91 IU/L (ref 44–121)
BUN/Creatinine Ratio: 16 (ref 9–23)
BUN: 12 mg/dL (ref 6–20)
Bilirubin Total: 0.5 mg/dL (ref 0.0–1.2)
CO2: 21 mmol/L (ref 20–29)
Calcium: 9.3 mg/dL (ref 8.7–10.2)
Chloride: 103 mmol/L (ref 96–106)
Creatinine, Ser: 0.73 mg/dL (ref 0.57–1.00)
Globulin, Total: 2.3 g/dL (ref 1.5–4.5)
Glucose: 102 mg/dL — ABNORMAL HIGH (ref 70–99)
Potassium: 4.2 mmol/L (ref 3.5–5.2)
Sodium: 139 mmol/L (ref 134–144)
Total Protein: 6.8 g/dL (ref 6.0–8.5)
eGFR: 110 mL/min/{1.73_m2} (ref >59)

## 2022-06-12 LAB — MICROALBUMIN / CREATININE URINE RATIO
Creatinine, Urine: 248.2 mg/dL
Microalb/Creat Ratio: 4 mg/g creat (ref 0–29)
Microalbumin, Urine: 10.6 ug/mL

## 2022-06-12 LAB — INSULIN, RANDOM: INSULIN: 19.3 u[IU]/mL (ref 2.6–24.9)

## 2022-06-13 ENCOUNTER — Encounter (INDEPENDENT_AMBULATORY_CARE_PROVIDER_SITE_OTHER): Payer: Self-pay | Admitting: Bariatrics

## 2022-06-13 DIAGNOSIS — E88819 Insulin resistance, unspecified: Secondary | ICD-10-CM | POA: Insufficient documentation

## 2022-06-24 ENCOUNTER — Encounter: Payer: Self-pay | Admitting: Bariatrics

## 2022-06-25 ENCOUNTER — Ambulatory Visit: Payer: 59 | Admitting: Bariatrics

## 2022-06-25 NOTE — Telephone Encounter (Signed)
LVM for patient to call back and r/s appt.

## 2022-07-02 NOTE — Progress Notes (Signed)
Chief Complaint:   OBESITY Virginia May (MR# [id number]) is a 37 y.o. female who presents for evaluation and treatment of obesity and related comorbidities. Current BMI is Body mass index is 52.08 kg/m. [name] has been struggling with her weight for many years and has been unsuccessful in either losing weight, maintaining weight loss, or reaching her healthy weight goal.  Virginia May is here for her initial visit.  She was here for an information session on 05/17/2022 with me.  She has changes around her cycle.  Virginia May is currently in the action stage of change and ready to dedicate time achieving and maintaining a healthier weight. Virginia May is interested in becoming our patient and working on intensive lifestyle modifications including (but not limited to) diet and exercise for weight loss.  Virginia May's habits were reviewed today and are as follows: Her family eats meals together, she thinks her family will eat healthier with her, her desired weight loss is 79 lbs, she has been heavy most of her life, she started gaining weight at age 89 years, her heaviest weight ever was 295 pounds, she has significant food cravings issues, she snacks frequently in the evenings, she wakes up frequently in the middle of the night to eat, she skips meals frequently, she is frequently drinking liquids with calories, she has problems with excessive hunger, she frequently eats larger portions than normal, and she struggles with emotional eating.  Depression Screen Virginia May's Food and Mood (modified PHQ-9) score was 10.  Subjective:   1. Other fatigue Journie admits to daytime somnolence and admits to waking up still tired. Patient has a history of symptoms of daytime fatigue and morning fatigue. Khamora generally gets 6 hours of sleep per night, and states that she has nightime awakenings. Snoring is not present. Apneic episodes are not present. Epworth Sleepiness Score is 8.   2. SOB (shortness of breath) on exertion Nelsy  notes increasing shortness of breath with exercising and seems to be worsening over time with weight gain. She notes getting out of breath sooner with activity than she used to. This has not gotten worse recently. Flonnie denies shortness of breath at rest or orthopnea.  3. Visceral obesity Visceral fat equals 19.  4. Vitamin D deficiency Patient is taking vitamin D.  Last vitamin D was 14.4 on 04/28/2022.  5. Health care maintenance Obesity.  6. Diabetes mellitus type 2 in obese ([place]) Patient's last hemoglobin A1c was 6.5.  This is a recent diagnosis.  Assessment/Plan:   1. Other fatigue Virginia May does feel that her weight is causing her energy to be lower than it should be. Fatigue may be related to obesity, depression or many other causes. Labs will be ordered, and in the meanwhile, Caslyn will focus on self care including making healthy food choices, increasing physical activity and focusing on stress reduction.  Discussed the importance of diet and exercise benefit on RMR.  - EKG 12-Lead  2. SOB (shortness of breath) on exertion Virginia May does feel that she gets out of breath more easily that she used to when she exercises. Virginia May's shortness of breath appears to be obesity related and exercise induced. She has agreed to work on weight loss and gradually increase exercise to treat her exercise induced shortness of breath. Will continue to monitor closely.  3. Visceral obesity Goal is less than 13.  Check labs today.  - Insulin, random - Comprehensive metabolic panel - Microalbumin / creatinine urine ratio  4. Vitamin D deficiency Continue  with high-dose vitamin D per PCP.  Check labs today.  - Insulin, random - Comprehensive metabolic panel - Microalbumin / creatinine urine ratio  5. Health care maintenance Check labs, EKG, IC today.  - Insulin, random - Comprehensive metabolic panel - Microalbumin / creatinine urine ratio  6. Diabetes mellitus type 2 in obese [facility]) Discussed  metformin and handout given.  GLP-1 sheet: Given.  Patient talked to [employer], will approve Trulicity.  check labs today.  - Insulin, random - Comprehensive metabolic panel - Microalbumin / creatinine urine ratio  Start - metFORMIN (GLUCOPHAGE) 500 MG tablet; Take 1 tablet (500 mg total) by mouth daily with lunch.  Dispense: 30 tablet; Refill: 0  7. Depression screening Makeba had a positive depression screening. Depression is commonly associated with obesity and often results in emotional eating behaviors. We will monitor this closely and work on CBT to help improve the non-hunger eating patterns. Referral to Psychology may be required if no improvement is seen as she continues in our clinic.  8. Morbid obesity ([place])  9. BMI 50.0-59.9, adult ([place])  1. Meal Planning  2. Mindful eating  3. Reviewed labs, CMP, lipid, vitamin D, B12.  4. Decrease portion size.   Virginia May is currently in the action stage of change and her goal is to continue with weight loss efforts. I recommend Adelheid begin the structured treatment plan as follows:  She has agreed to the Category 4 Plan +100 cal.  Exercise goals: All adults should avoid inactivity. Some physical activity is better than none, and adults who participate in any amount of physical activity gain some health benefits.   Behavioral modification strategies: increasing lean protein intake, decreasing simple carbohydrates, increasing vegetables, increasing water intake, decreasing eating out, no skipping meals, meal planning and cooking strategies, keeping healthy foods in the home, and planning for success.  She was informed of the importance of frequent follow-up visits to maximize her success with intensive lifestyle modifications for her multiple health conditions. She was informed we would discuss her lab results at her next visit unless there is a critical issue that needs to be addressed sooner. Layney agreed to keep her next visit at the  agreed upon time to discuss these results.  Objective:   Blood pressure 127/80, pulse 100, temperature 98.1 F (36.7 C), height 5\' 3"  (1.6 m), weight 294 lb (133.4 kg), SpO2 95 %. Body mass index is 52.08 kg/m.  EKG: Normal sinus rhythm, rate 97 bpm.  Indirect Calorimeter completed today shows a VO2 of 334 and a REE of 2304.  Her calculated basal metabolic rate is [id number] thus her basal metabolic rate is better than expected.  General: Cooperative, alert, well developed, in no acute distress. HEENT: Conjunctivae and lids unremarkable. Cardiovascular: Regular rhythm.  Lungs: Normal work of breathing. Neurologic: No focal deficits.   Lab Results  Component Value Date   CREATININE 0.73 06/11/2022   BUN 12 06/11/2022   NA 139 06/11/2022   K 4.2 06/11/2022   CL 103 06/11/2022   CO2 21 06/11/2022   Lab Results  Component Value Date   ALT 22 06/11/2022   AST 21 06/11/2022   ALKPHOS 91 06/11/2022   BILITOT 0.5 06/11/2022   Lab Results  Component Value Date   HGBA1C 6.5 (H) 05/16/2022   HGBA1C 5.5 06/07/2021   HGBA1C 5.5 06/16/2020   HGBA1C 5.2 08/25/2018   HGBA1C 6.1 (H) 08/21/2017   Lab Results  Component Value Date   INSULIN 19.3 06/11/2022  Lab Results  Component Value Date   TSH 2.650 05/16/2022   Lab Results  Component Value Date   CHOL 174 05/16/2022   HDL 43 05/16/2022   LDLCALC 108 (H) 05/16/2022   TRIG 131 05/16/2022   CHOLHDL 4.0 05/16/2022   Lab Results  Component Value Date   WBC 5.8 05/16/2022   HGB 14.3 05/16/2022   HCT 43.9 05/16/2022   MCV 79 05/16/2022   PLT 262 05/16/2022   No results found for: "IRON", "TIBC", "FERRITIN"  Attestation Statements:   Reviewed by clinician on day of visit: allergies, medications, problem list, medical history, surgical history, family history, social history, and previous encounter notes.  Delfina Redwood, am acting as Location manager for [employer], DO.  I have reviewed the above documentation for  accuracy and completeness, and I agree with the above. Jearld Lesch, DO

## 2022-07-04 ENCOUNTER — Other Ambulatory Visit: Payer: Self-pay | Admitting: Bariatrics

## 2022-07-04 DIAGNOSIS — E1169 Type 2 diabetes mellitus with other specified complication: Secondary | ICD-10-CM

## 2022-07-12 ENCOUNTER — Ambulatory Visit: Payer: 59 | Admitting: Bariatrics

## 2022-07-12 ENCOUNTER — Encounter: Payer: Self-pay | Admitting: Bariatrics

## 2022-07-12 VITALS — BP 125/87 | HR 96 | Temp 97.7°F | Ht 63.0 in | Wt 297.0 lb

## 2022-07-12 DIAGNOSIS — E038 Other specified hypothyroidism: Secondary | ICD-10-CM | POA: Diagnosis not present

## 2022-07-12 DIAGNOSIS — Z6841 Body Mass Index (BMI) 40.0 and over, adult: Secondary | ICD-10-CM | POA: Diagnosis not present

## 2022-07-12 DIAGNOSIS — Z7985 Long-term (current) use of injectable non-insulin antidiabetic drugs: Secondary | ICD-10-CM | POA: Diagnosis not present

## 2022-07-12 DIAGNOSIS — E559 Vitamin D deficiency, unspecified: Secondary | ICD-10-CM

## 2022-07-12 DIAGNOSIS — E1169 Type 2 diabetes mellitus with other specified complication: Secondary | ICD-10-CM

## 2022-07-12 DIAGNOSIS — E669 Obesity, unspecified: Secondary | ICD-10-CM

## 2022-07-12 DIAGNOSIS — Z7984 Long term (current) use of oral hypoglycemic drugs: Secondary | ICD-10-CM

## 2022-07-12 MED ORDER — TRULICITY 0.75 MG/0.5ML ~~LOC~~ SOAJ: 0.7500 mg | SUBCUTANEOUS | 0 refills | Status: DC

## 2022-07-12 MED ORDER — METFORMIN HCL 500 MG PO TABS: 500.0000 mg | ORAL_TABLET | Freq: Every day | ORAL | 0 refills | Status: DC

## 2022-07-16 NOTE — Progress Notes (Unsigned)
Chief Complaint:   OBESITY Virginia May is here to discuss her progress with her obesity treatment plan along with follow-up of her obesity related diagnoses. Virginia May is on the Category 4 Plan +100 cal and states she is following her eating plan approximately 75% of the time. Virginia May states she is walking 20-30 minutes 3 times per week.  Today's visit was #: 2 Starting weight: 294 LBS Starting date: 06/11/2022 Today's weight: 297 LBS Today's date: 07/12/2022 Total lbs lost to date: 0 Total lbs lost since last in-office visit: +3 LBS  Interim History: Patient is here for her follow-up.  She has been sick but now is feeling better.  She struggles with breakfast and missing meals.  Subjective:   1. Diabetes mellitus type 2 in obese [facility]) Patient is taking metformin.  Last A1c was 6.5.  2. Other specified hypothyroidism Patient is taking, ***  3. Vitamin D deficiency Patient is taking vitamin D.  Assessment/Plan:   1. Diabetes mellitus type 2 in obese ([place]) 1.  Refill- metFORMIN (GLUCOPHAGE) 500 MG tablet; Take 1 tablet (500 mg total) by mouth daily with lunch.  Dispense: 30 tablet; Refill: 0  2.  Will begin - Dulaglutide (TRULICITY) [id number] 0000000 SOPN; Inject 0.75 mg into the skin once a week.  Dispense: 2 mL; Refill: 0 (needs prior authorization)  3.  Dining out guide given to patient today.  2. Other specified hypothyroidism ***  3. Vitamin D deficiency Continue vitamin D.  4. Morbid obesity (HCC)  5. Obesity,current BMI 52.8 Begin- Dulaglutide (TRULICITY) [id number] 0000000 SOPN; Inject 0.75 mg into the skin once a week.  Dispense: 2 mL; Refill: 0  Virginia May is currently in the action stage of change. As such, her goal is to continue with weight loss efforts. She has agreed to the Category 4 Plan and keeping a food journal and adhering to recommended goals of 1800 calories and 100-120 protein.   Exercise goals:  Walking and counting steps.  Behavioral modification strategies:  increasing lean protein intake, decreasing simple carbohydrates, increasing vegetables, increasing water intake, decreasing eating out, no skipping meals, meal planning and cooking strategies, keeping healthy foods in the home, and planning for success.  Virginia May has agreed to follow-up with our clinic in 2 weeks. She was informed of the importance of frequent follow-up visits to maximize her success with intensive lifestyle modifications for her multiple health conditions.   Objective:   Blood pressure 125/87, pulse 96, temperature 97.7 F (36.5 C), height 5\' 3"  (1.6 m), weight 297 lb (134.7 kg), SpO2 97 %. Body mass index is 52.61 kg/m.  General: Cooperative, alert, well developed, in no acute distress. HEENT: Conjunctivae and lids unremarkable. Cardiovascular: Regular rhythm.  Lungs: Normal work of breathing. Neurologic: No focal deficits.   Lab Results  Component Value Date   CREATININE 0.73 06/11/2022   BUN 12 06/11/2022   NA 139 06/11/2022   K 4.2 06/11/2022   CL 103 06/11/2022   CO2 21 06/11/2022   Lab Results  Component Value Date   ALT 22 06/11/2022   AST 21 06/11/2022   ALKPHOS 91 06/11/2022   BILITOT 0.5 06/11/2022   Lab Results  Component Value Date   HGBA1C 6.5 (H) 05/16/2022   HGBA1C 5.5 06/07/2021   HGBA1C 5.5 06/16/2020   HGBA1C 5.2 08/25/2018   HGBA1C 6.1 (H) 08/21/2017   Lab Results  Component Value Date   INSULIN 19.3 06/11/2022   Lab Results  Component Value Date   TSH 2.650 05/16/2022  Lab Results  Component Value Date   CHOL 174 05/16/2022   HDL 43 05/16/2022   LDLCALC 108 (H) 05/16/2022   TRIG 131 05/16/2022   CHOLHDL 4.0 05/16/2022   Lab Results  Component Value Date   VD25OH 14.4 (L) 05/16/2022   VD25OH 19 (L) 08/25/2018   VD25OH 14 (L) 10/28/2014   Lab Results  Component Value Date   WBC 5.8 05/16/2022   HGB 14.3 05/16/2022   HCT 43.9 05/16/2022   MCV 79 05/16/2022   PLT 262 05/16/2022   No results found for: "IRON",  "TIBC", "FERRITIN"  Obesity Behavioral Intervention:   Approximately 15 minutes were spent on the discussion below.  ASK: We discussed the diagnosis of obesity with Nasro today and Roann agreed to give [place] permission to discuss obesity behavioral modification therapy today.  ASSESS: Virginia May has the diagnosis of obesity and her BMI today is ***. Dynesha {ACTION; IS/IS [id number] in the action stage of change.   ADVISE: Annique was educated on the multiple health risks of obesity as well as the benefit of weight loss to improve her health. She was advised of the need for long term treatment and the importance of lifestyle modifications to improve her current health and to decrease her risk of future health problems.  AGREE: Multiple dietary modification options and treatment options were discussed and Virginia May agreed to follow the recommendations documented in the above note.  ARRANGE: Virginia May was educated on the importance of frequent visits to treat obesity as outlined per CMS and USPSTF guidelines and agreed to schedule her next follow up appointment today.  Attestation Statements:   Reviewed by clinician on day of visit: allergies, medications, problem list, medical history, surgical history, family history, social history, and previous encounter notes.  ***(delete if time-based billing not used)Time spent on visit including pre-visit chart review and post-visit care and charting was *** minutes.   I, ***, am acting as transcriptionist for ***.  I have reviewed the above documentation for accuracy and completeness, and I agree with the above. -  ***

## 2022-07-17 ENCOUNTER — Ambulatory Visit: Payer: 59 | Admitting: Bariatrics

## 2022-07-18 ENCOUNTER — Encounter: Payer: Self-pay | Admitting: Bariatrics

## 2022-07-25 ENCOUNTER — Encounter: Payer: Self-pay | Admitting: Family Medicine

## 2022-07-26 ENCOUNTER — Ambulatory Visit: Payer: 59 | Admitting: Nurse Practitioner

## 2022-08-08 ENCOUNTER — Other Ambulatory Visit: Payer: Self-pay | Admitting: Bariatrics

## 2022-08-08 DIAGNOSIS — E66813 Obesity, class 3: Secondary | ICD-10-CM

## 2022-08-09 ENCOUNTER — Ambulatory Visit: Payer: 59 | Admitting: Family Medicine

## 2022-08-09 DIAGNOSIS — Z7984 Long term (current) use of oral hypoglycemic drugs: Secondary | ICD-10-CM | POA: Diagnosis not present

## 2022-08-09 DIAGNOSIS — E1169 Type 2 diabetes mellitus with other specified complication: Secondary | ICD-10-CM | POA: Diagnosis not present

## 2022-08-09 DIAGNOSIS — E669 Obesity, unspecified: Secondary | ICD-10-CM | POA: Diagnosis not present

## 2022-08-09 DIAGNOSIS — J4540 Moderate persistent asthma, uncomplicated: Secondary | ICD-10-CM

## 2022-08-09 MED ORDER — METFORMIN HCL 500 MG PO TABS
500.0000 mg | ORAL_TABLET | Freq: Every day | ORAL | 1 refills | Status: AC
Start: 2022-08-09 — End: ?

## 2022-08-09 MED ORDER — MONTELUKAST SODIUM 10 MG PO TABS
10.0000 mg | ORAL_TABLET | Freq: Every day | ORAL | 3 refills | Status: AC
Start: 2022-08-09 — End: ?

## 2022-08-09 MED ORDER — ALBUTEROL SULFATE HFA 108 (90 BASE) MCG/ACT IN AERS: INHALATION_SPRAY | RESPIRATORY_TRACT | 3 refills | Status: DC

## 2022-08-09 MED ORDER — TRULICITY 0.75 MG/0.5ML ~~LOC~~ SOAJ: 0.7500 mg | SUBCUTANEOUS | 0 refills | Status: AC

## 2022-08-09 MED ORDER — BUDESONIDE-FORMOTEROL FUMARATE 160-4.5 MCG/ACT IN AERO: 1.0000 | INHALATION_SPRAY | Freq: Two times a day (BID) | RESPIRATORY_TRACT | 3 refills | Status: DC

## 2022-08-09 NOTE — Progress Notes (Signed)
Established Patient Office Visit  Subjective   Patient ID: Virginia May, female    DOB: 1985-04-27  Age: 37 y.o. MRN: [id number]  Chief Complaint  Patient presents with   Medication Refill    Patient states that she is here to get refills on a few of  her medicatons    HPI  Pt has been on Metformin and Trulicity for weight loss and prediabetes for the last 6 weeks. She has been cutting carbs and doing portion control. She is tracking her food. She was on vacation for 2 weeks.  She has asthma and needs her Albuterol, Symbicort, and Singulair refilled.   Review of Systems  All other systems reviewed and are negative.    Objective:     BP 121/85   Pulse 94   Temp (!) 97.4 F (36.3 C) (Oral)   Resp 20   Ht  (1.6 m)   Wt (!) 300 lb 14.4 oz (136.5 kg)   SpO2 97%   BMI 53.30 kg/m    Physical Exam Vitals and nursing note reviewed.  Constitutional:      Appearance: Normal appearance. She is obese.  HENT:     Head: Normocephalic and atraumatic.     Right Ear: External ear normal.     Left Ear: External ear normal.     Nose: Nose normal.     Mouth/Throat:     Mouth: Mucous membranes are moist.     Pharynx: Oropharynx is clear.  Eyes:     Conjunctiva/sclera: Conjunctivae normal.     Pupils: Pupils are equal, round, and reactive to light.  Cardiovascular:     Rate and Rhythm: Normal rate and regular rhythm.     Pulses: Normal pulses.     Heart sounds: Normal heart sounds.  Pulmonary:     Effort: Pulmonary effort is normal.     Breath sounds: Normal breath sounds.  Abdominal:     General: Abdomen is flat.  Skin:    Capillary Refill: Capillary refill takes less than 2 seconds.  Neurological:     General: No focal deficit present.     Mental Status: She is alert and oriented to person, place, and time. Mental status is at baseline.  Psychiatric:        Mood and Affect: Mood normal.        Behavior: Behavior normal.        Thought Content: Thought content  normal.        Judgment: Judgment normal.    No results found for any visits on 08/09/22.    The ASCVD Risk score (Arnett DK, et al., 2019) failed to calculate for the following reasons:   The 2019 ASCVD risk score is only valid for ages 61 to 67    Assessment & Plan:   Moderate persistent asthma without complication -     Albuterol Sulfate HFA; INHALE 2 PUFFS INTO THE LUNGS EVERY 6 HOURS AS NEEDED FOR WHEEZE  Dispense: 13.4 each; Refill: 3 -     Budesonide-Formoterol Fumarate; Inhale 1 puff into the lungs 2 (two) times daily.  Dispense: 1 each; Refill: 3 -     Montelukast Sodium; Take 1 tablet (10 mg total) by mouth at bedtime.  Dispense: 90 tablet; Refill: 3  Obesity,current BMI 52.8 -     Trulicity; Inject 0.75 mg into the skin once a week.  Dispense: 6 mL; Refill: 0  Type 2 diabetes mellitus with obesity -  metFORMIN HCl; Take 1 tablet (500 mg total) by mouth daily with lunch.  Dispense: 90 tablet; Refill: 1   Refilled chronic medicines for asthma and diabetes/obesity. See back for follow up for weight management in 3 months Continue diet and exercise for now.   No follow-ups on file.    Suzan Slick, MD

## 2022-09-03 ENCOUNTER — Telehealth: Payer: 59 | Admitting: Nurse Practitioner

## 2022-09-03 DIAGNOSIS — J0140 Acute pansinusitis, unspecified: Secondary | ICD-10-CM | POA: Diagnosis not present

## 2022-09-03 MED ORDER — AMOXICILLIN-POT CLAVULANATE 875-125 MG PO TABS
1.0000 | ORAL_TABLET | Freq: Two times a day (BID) | ORAL | 0 refills | Status: AC
Start: 2022-09-03 — End: 2022-09-10

## 2022-09-03 NOTE — Progress Notes (Signed)
Virtual Visit Consent   Virginia May, you are scheduled for a virtual visit with a [facility] provider today. Just as with appointments in the office, your consent must be obtained to participate. Your consent will be active for this visit and any virtual visit you may have with one of our providers in the next 365 days. If you have a MyChart account, a copy of this consent can be sent to you electronically.  As this is a virtual visit, video technology does not allow for your provider to perform a traditional examination. This may limit your provider's ability to fully assess your condition. If your provider identifies any concerns that need to be evaluated in person or the need to arrange testing (such as labs, EKG, etc.), we will make arrangements to do so. Although advances in technology are sophisticated, we cannot ensure that it will always work on either your end or our end. If the connection with a video visit is poor, the visit may have to be switched to a telephone visit. With either a video or telephone visit, we are not always able to ensure that we have a secure connection.  By engaging in this virtual visit, you consent to the provision of healthcare and authorize for your insurance to be billed (if applicable) for the services provided during this visit. Depending on your insurance coverage, you may receive a charge related to this service.  I need to obtain your verbal consent now. Are you willing to proceed with your visit today? [name] has provided verbal consent on 09/03/2022 for a virtual visit (video or telephone). Viviano Simas, FNP  Date: 09/03/2022 8:00 AM  Virtual Visit via Video Note   I, Viviano Simas, connected with  Virginia May  ([id number], 10/18/1985) on 09/03/22 at  8:15 AM EDT by a video-enabled telemedicine application and verified that I am speaking with the correct person using two identifiers.  Location: Patient: Virtual Visit Location Patient:  Home Provider: Virtual Visit Location Provider: Home Office   I discussed the limitations of evaluation and management by telemedicine and the availability of in person appointments. The patient expressed understanding and agreed to proceed.    History of Present Illness: Virginia May is a 37 y.o. who identifies as a female who was assigned female at birth, and is being seen today for sinus congestion and a cough.  This has been going on for over a week  She started with the sinus congestion  Does have a productive cough now as well that she feels is from her post nasal drainage   Denies fevers  She does have a history of asthma, and has needed her inhaler in the past week   She has tried Mucinex and sudafed for her symptoms    Problems:  Patient Active Problem List   Diagnosis Date Noted   Insulin resistance 06/13/2022   Health care maintenance 06/11/2022   SOB (shortness of breath) on exertion 06/11/2022   Morbid obesity (HCC) 05/17/2022   BMI 50.0-59.9, adult ([facility]) 05/17/2022   Elevated blood pressure reading 05/17/2022   Diabetes mellitus type 2 in obese 05/17/2022   Fatigue 05/17/2022   Visceral obesity 05/17/2022   Abnormal uterine bleeding 06/20/2021   Hypothyroid 06/07/2021   Iron deficiency 06/07/2021   Primary insomnia 06/07/2021   Annual physical exam 06/07/2021   Seasonal allergic rhinitis 07/21/2018   Primary osteoarthritis of right foot 10/16/2017   Obesity 10/28/2014   Asthma, moderate persistent 10/28/2014  Vitamin D deficiency 06/26/2012    Allergies:  Allergies  Allergen Reactions   Ibuprofen Shortness Of Breath   Medications:  Current Outpatient Medications:    albuterol (VENTOLIN HFA) 108 (90 Base) MCG/ACT inhaler, INHALE 2 PUFFS INTO THE LUNGS EVERY 6 HOURS AS NEEDED FOR WHEEZE, Disp: 13.4 each, Rfl: 3   budesonide-formoterol (SYMBICORT) 160-4.5 MCG/ACT inhaler, Inhale 1 puff into the lungs 2 (two) times daily., Disp: 1 each, Rfl: 3    cetirizine (ZYRTEC) 10 MG tablet, Take 1 tablet (10 mg total) by mouth daily., Disp: 90 tablet, Rfl: 3   Dulaglutide (TRULICITY) 0.75 MG/0.5ML SOPN, Inject 0.75 mg into the skin once a week., Disp: 6 mL, Rfl: 0   fluticasone (FLONASE) 50 MCG/ACT nasal spray, Place 1 spray into both nostrils daily., Disp: 16 g, Rfl: 6   levonorgestrel (MIRENA, 52 MG,) 20 MCG/DAY IUD, 1 each by Intrauterine route once., Disp: , Rfl:    metFORMIN (GLUCOPHAGE) 500 MG tablet, Take 1 tablet (500 mg total) by mouth daily with lunch., Disp: 90 tablet, Rfl: 1   montelukast (SINGULAIR) 10 MG tablet, Take 1 tablet (10 mg total) by mouth at bedtime., Disp: 90 tablet, Rfl: 3   Vitamin D, Ergocalciferol, (DRISDOL) 1.25 MG (50000 UNIT) CAPS capsule, Take 1 capsule (50,000 Units total) by mouth every 7 (seven) days., Disp: 8 capsule, Rfl: 3  Observations/Objective: Patient is well-developed, well-nourished in no acute distress.  Resting comfortably  at home.  Head is normocephalic, atraumatic.  No labored breathing.  Speech is clear and coherent with logical content.  Patient is alert and oriented at baseline.    Assessment and Plan: 1. Acute non-recurrent pansinusitis  Continue flonase and Mucinex   - amoxicillin-clavulanate (AUGMENTIN) 875-125 MG tablet; Take 1 tablet by mouth 2 (two) times daily for 7 days. Take with food  Dispense: 14 tablet; Refill: 0     Follow Up Instructions: I discussed the assessment and treatment plan with the patient. The patient was provided an opportunity to ask questions and all were answered. The patient agreed with the plan and demonstrated an understanding of the instructions.  A copy of instructions were sent to the patient via MyChart unless otherwise noted below.    The patient was advised to call back or seek an in-person evaluation if the symptoms worsen or if the condition fails to improve as anticipated.  Time:  I spent 10 minutes with the patient via telehealth technology  discussing the above problems/concerns.    Viviano Simas, FNP

## 2022-09-04 DIAGNOSIS — Z01419 Encounter for gynecological examination (general) (routine) without abnormal findings: Secondary | ICD-10-CM | POA: Diagnosis not present

## 2022-11-08 ENCOUNTER — Ambulatory Visit: Payer: 59 | Admitting: Family Medicine

## 2022-11-10 ENCOUNTER — Telehealth: Payer: 59 | Admitting: Physician Assistant

## 2022-11-10 DIAGNOSIS — J019 Acute sinusitis, unspecified: Secondary | ICD-10-CM

## 2022-11-10 DIAGNOSIS — B9689 Other specified bacterial agents as the cause of diseases classified elsewhere: Secondary | ICD-10-CM

## 2022-11-10 MED ORDER — AMOXICILLIN-POT CLAVULANATE 875-125 MG PO TABS
1.0000 | ORAL_TABLET | Freq: Two times a day (BID) | ORAL | 0 refills | Status: DC
Start: 2022-11-10 — End: 2023-01-07

## 2022-11-10 MED ORDER — BENZONATATE 100 MG PO CAPS
100.0000 mg | ORAL_CAPSULE | Freq: Three times a day (TID) | ORAL | 0 refills | Status: DC | PRN
Start: 2022-11-10 — End: 2023-01-07

## 2022-11-10 NOTE — Progress Notes (Signed)
Virtual Visit Consent   Virginia May, you are scheduled for a virtual visit with a [facility] provider today. Just as with appointments in the office, your consent must be obtained to participate. Your consent will be active for this visit and any virtual visit you may have with one of our providers in the next 365 days. If you have a MyChart account, a copy of this consent can be sent to you electronically.  As this is a virtual visit, video technology does not allow for your provider to perform a traditional examination. This may limit your provider's ability to fully assess your condition. If your provider identifies any concerns that need to be evaluated in person or the need to arrange testing (such as labs, EKG, etc.), we will make arrangements to do so. Although advances in technology are sophisticated, we cannot ensure that it will always work on either your end or our end. If the connection with a video visit is poor, the visit may have to be switched to a telephone visit. With either a video or telephone visit, we are not always able to ensure that we have a secure connection.  By engaging in this virtual visit, you consent to the provision of healthcare and authorize for your insurance to be billed (if applicable) for the services provided during this visit. Depending on your insurance coverage, you may receive a charge related to this service.  I need to obtain your verbal consent now. Are you willing to proceed with your visit today? Virginia May has provided verbal consent on 11/10/2022 for a virtual visit (video or telephone). Tylene Fantasia Ward, PA-C  Date: 11/10/2022 12:48 PM  Virtual Visit via Video Note   I, Tylene Fantasia Ward, connected with  Virginia May  ([id number], 09-05-1985) on 11/10/22 at 12:45 PM EDT by a video-enabled telemedicine application and verified that I am speaking with the correct person using two identifiers.  Location: Patient: Virtual Visit Location Patient:  Home Provider: Virtual Visit Location Provider: Home   I discussed the limitations of evaluation and management by telemedicine and the availability of in person appointments. The patient expressed understanding and agreed to proceed.    History of Present Illness: Virginia May is a 37 y.o. who identifies as a female who was assigned female at birth, and is being seen today for congestion, cough, sinus pressure that started about one week ago .She is taking Mucinex and nyquil with minimal relief.  She does have a h/o asthma and has been using her inhaler with some improvement.  Denies shortness of breath.   HPI: HPI  Problems:  Patient Active Problem List   Diagnosis Date Noted   Insulin resistance 06/13/2022   Health care maintenance 06/11/2022   SOB (shortness of breath) on exertion 06/11/2022   Morbid obesity (HCC) 05/17/2022   BMI 50.0-59.9, adult (HCC) 05/17/2022   Elevated blood pressure reading 05/17/2022   Diabetes mellitus type 2 in obese 05/17/2022   Fatigue 05/17/2022   Visceral obesity 05/17/2022   Abnormal uterine bleeding 06/20/2021   Hypothyroid 06/07/2021   Iron deficiency 06/07/2021   Primary insomnia 06/07/2021   Annual physical exam 06/07/2021   Seasonal allergic rhinitis 07/21/2018   Primary osteoarthritis of right foot 10/16/2017   Obesity 10/28/2014   Asthma, moderate persistent 10/28/2014   Vitamin D deficiency 06/26/2012    Allergies:  Allergies  Allergen Reactions   Ibuprofen Shortness Of Breath   Medications:  Current Outpatient Medications:  amoxicillin-clavulanate (AUGMENTIN) 875-125 MG tablet, Take 1 tablet by mouth 2 (two) times daily., Disp: 20 tablet, Rfl: 0   benzonatate (TESSALON) 100 MG capsule, Take 1 capsule (100 mg total) by mouth 3 (three) times daily as needed., Disp: 20 capsule, Rfl: 0   albuterol (VENTOLIN HFA) 108 (90 Base) MCG/ACT inhaler, INHALE 2 PUFFS INTO THE LUNGS EVERY 6 HOURS AS NEEDED FOR WHEEZE, Disp: 13.4 each, Rfl:  3   budesonide-formoterol (SYMBICORT) 160-4.5 MCG/ACT inhaler, Inhale 1 puff into the lungs 2 (two) times daily., Disp: 1 each, Rfl: 3   cetirizine (ZYRTEC) 10 MG tablet, Take 1 tablet (10 mg total) by mouth daily., Disp: 90 tablet, Rfl: 3   Dulaglutide (TRULICITY) 0.75 MG/0.5ML SOPN, Inject 0.75 mg into the skin once a week., Disp: 6 mL, Rfl: 0   fluticasone (FLONASE) 50 MCG/ACT nasal spray, Place 1 spray into both nostrils daily., Disp: 16 g, Rfl: 6   levonorgestrel (MIRENA, 52 MG,) 20 MCG/DAY IUD, 1 each by Intrauterine route once., Disp: , Rfl:    metFORMIN (GLUCOPHAGE) 500 MG tablet, Take 1 tablet (500 mg total) by mouth daily with lunch., Disp: 90 tablet, Rfl: 1   montelukast (SINGULAIR) 10 MG tablet, Take 1 tablet (10 mg total) by mouth at bedtime., Disp: 90 tablet, Rfl: 3   Vitamin D, Ergocalciferol, (DRISDOL) 1.25 MG (50000 UNIT) CAPS capsule, Take 1 capsule (50,000 Units total) by mouth every 7 (seven) days., Disp: 8 capsule, Rfl: 3  Observations/Objective: Patient is well-developed, well-nourished in no acute distress.  Resting comfortably at home.  Head is normocephalic, atraumatic.  No labored breathing.  Speech is clear and coherent with logical content.  Patient is alert and oriented at baseline.    Assessment and Plan: 1. Acute bacterial sinusitis - amoxicillin-clavulanate (AUGMENTIN) 875-125 MG tablet; Take 1 tablet by mouth 2 (two) times daily.  Dispense: 20 tablet; Refill: 0 - benzonatate (TESSALON) 100 MG capsule; Take 1 capsule (100 mg total) by mouth 3 (three) times daily as needed.  Dispense: 20 capsule; Refill: 0    Follow Up Instructions: I discussed the assessment and treatment plan with the patient. The patient was provided an opportunity to ask questions and all were answered. The patient agreed with the plan and demonstrated an understanding of the instructions.  A copy of instructions were sent to the patient via MyChart unless otherwise noted below.      The patient was advised to call back or seek an in-person evaluation if the symptoms worsen or if the condition fails to improve as anticipated.  Time:  I spent 5 minutes with the patient via telehealth technology discussing the above problems/concerns.    Tylene Fantasia Ward, PA-C

## 2022-11-10 NOTE — Patient Instructions (Signed)
[name], thank you for joining Tylene Fantasia Ward, PA-C for today's virtual visit.  While this provider is not your primary care provider (PCP), if your PCP is located in our provider database this encounter information will be shared with them immediately following your visit.   A [facility] MyChart account gives you access to today's visit and all your visits, tests, and labs performed at [facility] " click here if you don't have a [facility] MyChart account or go to [email or web address]  Consent: (Patient) [name] provided verbal consent for this virtual visit at the beginning of the encounter.  Current Medications:  Current Outpatient Medications:    amoxicillin-clavulanate (AUGMENTIN) 875-125 MG tablet, Take 1 tablet by mouth 2 (two) times daily., Disp: 20 tablet, Rfl: 0   benzonatate (TESSALON) 100 MG capsule, Take 1 capsule (100 mg total) by mouth 3 (three) times daily as needed., Disp: 20 capsule, Rfl: 0   albuterol (VENTOLIN HFA) 108 (90 Base) MCG/ACT inhaler, INHALE 2 PUFFS INTO THE LUNGS EVERY 6 HOURS AS NEEDED FOR WHEEZE, Disp: 13.4 each, Rfl: 3   budesonide-formoterol (SYMBICORT) 160-4.5 MCG/ACT inhaler, Inhale 1 puff into the lungs 2 (two) times daily., Disp: 1 each, Rfl: 3   cetirizine (ZYRTEC) 10 MG tablet, Take 1 tablet (10 mg total) by mouth daily., Disp: 90 tablet, Rfl: 3   Dulaglutide (TRULICITY) 0.75 MG/0.5ML SOPN, Inject 0.75 mg into the skin once a week., Disp: 6 mL, Rfl: 0   fluticasone (FLONASE) 50 MCG/ACT nasal spray, Place 1 spray into both nostrils daily., Disp: 16 g, Rfl: 6   levonorgestrel (MIRENA, 52 MG,) 20 MCG/DAY IUD, 1 each by Intrauterine route once., Disp: , Rfl:    metFORMIN (GLUCOPHAGE) 500 MG tablet, Take 1 tablet (500 mg total) by mouth daily with lunch., Disp: 90 tablet, Rfl: 1   montelukast (SINGULAIR) 10 MG tablet, Take 1 tablet (10 mg total) by mouth at bedtime., Disp: 90 tablet, Rfl: 3   Vitamin D, Ergocalciferol,  (DRISDOL) 1.25 MG (50000 UNIT) CAPS capsule, Take 1 capsule (50,000 Units total) by mouth every 7 (seven) days., Disp: 8 capsule, Rfl: 3   Medications ordered in this encounter:  Meds ordered this encounter  Medications   amoxicillin-clavulanate (AUGMENTIN) 875-125 MG tablet    Sig: Take 1 tablet by mouth 2 (two) times daily.    Dispense:  20 tablet    Refill:  0    Order Specific Question:   Supervising Provider    Answer:   Merrilee Jansky [[id number]]   benzonatate (TESSALON) 100 MG capsule    Sig: Take 1 capsule (100 mg total) by mouth 3 (three) times daily as needed.    Dispense:  20 capsule    Refill:  0    Order Specific Question:   Supervising Provider    Answer:   Merrilee Jansky [id number]     *If you need refills on other medications prior to your next appointment, please contact your pharmacy*  Follow-Up: Call back or seek an in-person evaluation if the symptoms worsen or if the condition fails to improve as anticipated.  [facility] [phone]  Other Instructions Take antibiotic as prescribed.  Can take tessalon as needed for cough.  Recommend Mucinex and Flonase. Drink plenty of water, rest. If symptoms worsen follow up for in person evaluation with primary care physician or Urgent Care.    If you have been instructed to have an in-person evaluation today at a local Urgent  Care facility, please use the link below. It will take you to a list of all of our available [facility], including address, phone number and hours of operation. Please do not delay care.  [facility]  If you or a family member do not have a primary care provider, use the link below to schedule a visit and establish care. When you choose a [facility] primary care physician or advanced practice provider, you gain a long-term partner in health. Find a Primary Care Provider  Learn more about [facility] in-office and virtual care options: [facility] -  Get Care Now

## 2022-12-26 ENCOUNTER — Other Ambulatory Visit: Payer: Self-pay | Admitting: Sports Medicine

## 2022-12-26 DIAGNOSIS — J4540 Moderate persistent asthma, uncomplicated: Secondary | ICD-10-CM

## 2023-01-04 ENCOUNTER — Ambulatory Visit
Admission: RE | Admit: 2023-01-04 | Discharge: 2023-01-04 | Disposition: A | Discharge status: Home / Self Care | Payer: 59 | Source: Ambulatory Visit | Attending: Physician Assistant | Admitting: Physician Assistant

## 2023-01-04 VITALS — BP 142/89 | HR 95 | Temp 98.1°F | Resp 17

## 2023-01-04 DIAGNOSIS — J4541 Moderate persistent asthma with (acute) exacerbation: Secondary | ICD-10-CM

## 2023-01-04 MED ORDER — ALBUTEROL SULFATE HFA 108 (90 BASE) MCG/ACT IN AERS
2.0000 | INHALATION_SPRAY | RESPIRATORY_TRACT | Status: DC | PRN
  Administered 2023-01-04: 2 via RESPIRATORY_TRACT

## 2023-01-04 MED ORDER — PREDNISONE 50 MG PO TABS: ORAL_TABLET | ORAL | 0 refills | Status: AC

## 2023-01-04 MED ORDER — PREDNISONE 50 MG PO TABS: 60.0000 mg | ORAL_TABLET | Freq: Every day | ORAL | Status: DC

## 2023-01-04 NOTE — ED Triage Notes (Addendum)
Pt c/o cough and chest congestion x 8 days. Was seen at another UC on Tues. Received a breathing treatment. Cough worsening in last 24 hours. Hx of bronchitis and asthma. Having to use inhalers more than usual.

## 2023-01-04 NOTE — Discharge Instructions (Signed)
See your Physicain for recheck next week.

## 2023-01-04 NOTE — ED Provider Notes (Signed)
[facility]    CSN: [id number] Arrival date & time: 01/04/23  1332      History   Chief Complaint Chief Complaint  Patient presents with   Cough    Chest congestion    HPI Virginia May is a 37 y.o. female.   Patient complains patient of asthma.  Patient reports she has been using her inhaler more often than scheduled due to shortness of breath.  Patient reports she was seen earlier in the week at another urgent care and was encouraged to do home nebs.  Patient reports she has increasing shortness of breath.  Patient denies any fever or chills she denies any symptoms of illness.  Patient reports that this feels like her previous asthma exacerbations.  Patient reports she has done well on prednisone in the past.   Cough   Past Medical History:  Diagnosis Date   Asthma, moderate persistent 10/28/2014   Diabetes (HCC)    Headache    Obesity 10/28/2014   PONV (postoperative nausea and vomiting)    Tibia/fibula fracture r    Patient Active Problem List   Diagnosis Date Noted   Insulin resistance 06/13/2022   Health care maintenance 06/11/2022   SOB (shortness of breath) on exertion 06/11/2022   Morbid obesity (HCC) 05/17/2022   BMI 50.0-59.9, adult (HCC) 05/17/2022   Elevated blood pressure reading 05/17/2022   Diabetes mellitus type 2 in obese 05/17/2022   Fatigue 05/17/2022   Visceral obesity 05/17/2022   Abnormal uterine bleeding 06/20/2021   Hypothyroid 06/07/2021   Iron deficiency 06/07/2021   Primary insomnia 06/07/2021   Annual physical exam 06/07/2021   Seasonal allergic rhinitis 07/21/2018   Primary osteoarthritis of right foot 10/16/2017   Obesity 10/28/2014   Asthma, moderate persistent 10/28/2014   Vitamin D deficiency 06/26/2012    Past Surgical History:  Procedure Laterality Date   ARTHRODESIS METATARSAL Right 04/17/2018   Procedure: 1ST AND 2ND TARSOMETATARSAL ARTHRODESIS -W- POSSIBLE 3RD TARSOMETATARSAL FUSION AND ANKLE ARTHROSCOPY  DEBRIDEMENT -W- POSSIBLE LATERAL LIGAMENT RECONSTRUCTION AND HARDWARE REMOVAL;  Surgeon: Terance Hart, MD;  Location: [facility] OR;  Service: Orthopedics;  Laterality: Right;  LENGTH: 210 HRS   IM NAILING TIBIA Right    RECONSTRUCTION OF ANGULAR DEFORMITY,TOE Left 01/23/2022   Procedure: ANGULAR CORRECTION OF SECOND TOE;  Surgeon: Terance Hart, MD;  Location[facility] OR;  Service: Orthopedics;  Laterality: Left;   REPAIR EXTENSOR TENDON WITH METATARSAL OSTEOTOMY AND OPEN REDUCTION IN Left 01/23/2022   Procedure: LEFT SECOND METATARSOPHALANGEAL JOINT CAPSULOTOMY, SECOND METATARSAL OSTEOTOMY, PLANTAR PLATE RECONSTRUCTION;  Surgeon: Terance Hart, MD;  Location: [facility] OR;  Service: Orthopedics;  Laterality: Left;  LENGTH OF SURGERY: 90 MINUTES    OB History     Gravida  0   Para  0   Term  0   Preterm  0   AB  0   Living  0      SAB  0   IAB  0   Ectopic  0   Multiple  0   Live Births  0            Home Medications    Prior to Admission medications   Medication Sig Start Date End Date Taking? Authorizing Provider  predniSONE (DELTASONE) 50 MG tablet One tablet a day for 5 days 01/04/23  Yes Elson Areas, PA-C  albuterol (VENTOLIN HFA) 108 (90 Base) MCG/ACT inhaler INHALE 2 PUFFS INTO THE LUNGS EVERY 6 HOURS AS NEEDED FOR  WHEEZE 12/27/22   Monica Becton, MD  amoxicillin-clavulanate (AUGMENTIN) 875-125 MG tablet Take 1 tablet by mouth 2 (two) times daily. 11/10/22   Ward, Tylene Fantasia, PA-C  benzonatate (TESSALON) 100 MG capsule Take 1 capsule (100 mg total) by mouth 3 (three) times daily as needed. 11/10/22   Ward, Tylene Fantasia, PA-C  budesonide-formoterol (SYMBICORT) 160-4.5 MCG/ACT inhaler Inhale 1 puff into the lungs 2 (two) times daily. 08/09/22   Suzan Slick, MD  cetirizine (ZYRTEC) 10 MG tablet Take 1 tablet (10 mg total) by mouth daily. 06/16/20   Monica Becton, MD  Dulaglutide (TRULICITY) 0.75 MG/0.5ML SOPN Inject 0.75 mg into the skin once  a week. 08/09/22   Suzan Slick, MD  fluticasone (FLONASE) 50 MCG/ACT nasal spray Place 1 spray into both nostrils daily. 05/16/22   Suzan Slick, MD  levonorgestrel (MIRENA, 52 MG,) 20 MCG/DAY IUD 1 each by Intrauterine route once. 01/16/22   [provider]  metFORMIN (GLUCOPHAGE) 500 MG tablet Take 1 tablet (500 mg total) by mouth daily with lunch. 08/09/22   Suzan Slick, MD  montelukast (SINGULAIR) 10 MG tablet Take 1 tablet (10 mg total) by mouth at bedtime. 08/09/22   Suzan Slick, MD  Vitamin D, Ergocalciferol, (DRISDOL) 1.25 MG (50000 UNIT) CAPS capsule Take 1 capsule (50,000 Units total) by mouth every 7 (seven) days. 05/17/22   Suzan Slick, MD    Family History Family History  Problem Relation Age of Onset   Diabetes Mother    Hypertension Mother    Diabetes Father    Hypertension Father    High Cholesterol Father    Stroke Maternal Grandmother    Hypertension Maternal Grandmother    Stroke Maternal Grandfather    Cancer Maternal Uncle     Social History Social History   Tobacco Use   Smoking status: Never   Smokeless tobacco: Never  Vaping Use   Vaping status: Never Used  Substance Use Topics   Alcohol use: No   Drug use: No     Allergies   Ibuprofen   Review of Systems Review of Systems  Respiratory:  Positive for cough.   All other systems reviewed and are negative.    Physical Exam Triage Vital Signs ED Triage Vitals  Encounter Vitals Group     BP 01/04/23 1340 (!) 142/89     Systolic BP Percentile --      Diastolic BP Percentile --      Pulse Rate 01/04/23 1340 95     Resp 01/04/23 1340 17     Temp 01/04/23 1340 98.1 F (36.7 C)     Temp Source 01/04/23 1340 Oral     SpO2 01/04/23 1340 94 %     Weight --      Height --      Head Circumference --      Peak Flow --      Pain Score 01/04/23 1345 0     Pain Loc --      Pain Education --      Exclude from Growth Chart --    No data found.  Updated Vital  Signs BP (!) 142/89 (BP Location: Right Arm)   Pulse 95   Temp 98.1 F (36.7 C) (Oral)   Resp 17   SpO2 94%   Visual Acuity Right Eye Distance:   Left Eye Distance:   Bilateral Distance:    Right Eye Near:   Left Eye Near:  Bilateral Near:     Physical Exam Vitals and nursing note reviewed.  Constitutional:      Appearance: She is well-developed.  HENT:     Head: Normocephalic.  Cardiovascular:     Rate and Rhythm: Normal rate.  Pulmonary:     Effort: Pulmonary effort is normal.     Breath sounds: Wheezing present.  Abdominal:     General: There is no distension.  Musculoskeletal:        General: Normal range of motion.     Cervical back: Normal range of motion.  Skin:    General: Skin is warm.  Neurological:     General: No focal deficit present.     Mental Status: She is alert and oriented to person, place, and time.      UC Treatments / Results  Labs (all labs ordered are listed, but only abnormal results are displayed) Labs Reviewed - No data to display  EKG   Radiology No results found.  Procedures Procedures (including critical care time)  Medications Ordered in UC Medications  predniSONE (DELTASONE) tablet 60 mg (has no administration in time range)  albuterol (VENTOLIN HFA) 108 (90 Base) MCG/ACT inhaler 2 puff (2 puffs Inhalation Given 01/04/23 1412)    Initial Impression / Assessment and Plan / UC Course  I have reviewed the triage vital signs and the nursing notes.  Pertinent labs & imaging results that were available during my care of the patient were reviewed by me and considered in my medical decision making (see chart for details).     MDM: Patient is given prednisone here.  Rx for prednsione Final Clinical Impressions(s) / UC Diagnoses   Final diagnoses:  Moderate persistent asthma with exacerbation     Discharge Instructions      See your Physicain for recheck next week.    ED Prescriptions     Medication Sig  Dispense Auth. Provider   predniSONE (DELTASONE) 50 MG tablet One tablet a day for 5 days 5 tablet Elson Areas, [place]      PDMP not reviewed this encounter. An After Visit Summary was printed and given to the patient.    Elson Areas, [place] 01/04/23 1650

## 2023-01-07 ENCOUNTER — Ambulatory Visit
Admission: RE | Admit: 2023-01-07 | Discharge: 2023-01-07 | Disposition: A | Discharge status: Home / Self Care | Payer: 59 | Source: Ambulatory Visit | Attending: Urgent Care | Admitting: Urgent Care

## 2023-01-07 ENCOUNTER — Other Ambulatory Visit: Payer: Self-pay

## 2023-01-07 VITALS — BP 120/80 | HR 90 | Temp 97.9°F | Resp 20

## 2023-01-07 DIAGNOSIS — J4541 Moderate persistent asthma with (acute) exacerbation: Secondary | ICD-10-CM | POA: Diagnosis not present

## 2023-01-07 MED ORDER — TRELEGY ELLIPTA 100-62.5-25 MCG/ACT IN AEPB: 1.0000 | INHALATION_SPRAY | Freq: Every day | RESPIRATORY_TRACT | 2 refills | Status: AC

## 2023-01-07 MED ORDER — ALBUTEROL SULFATE (2.5 MG/3ML) 0.083% IN NEBU
2.5000 mg | INHALATION_SOLUTION | Freq: Once | RESPIRATORY_TRACT | Status: AC
  Administered 2023-01-07: 2.5 mg via RESPIRATORY_TRACT

## 2023-01-07 MED ORDER — ALBUTEROL SULFATE (2.5 MG/3ML) 0.083% IN NEBU: 2.5000 mg | INHALATION_SOLUTION | RESPIRATORY_TRACT | 12 refills | Status: AC | PRN

## 2023-01-07 MED ORDER — AZITHROMYCIN 250 MG PO TABS: 250.0000 mg | ORAL_TABLET | Freq: Every day | ORAL | 0 refills | Status: AC

## 2023-01-07 MED ORDER — METHYLPREDNISOLONE SODIUM SUCC 125 MG IJ SOLR
80.0000 mg | Freq: Once | INTRAMUSCULAR | Status: AC
  Administered 2023-01-07: 80 mg via INTRAMUSCULAR

## 2023-01-07 NOTE — Discharge Instructions (Signed)
To help with your asthma exacerbation, please use the albuterol nebulizer solution every 4 hours around-the-clock for the next 24 hours.  If you develop a rapid heartbeat, or tremor, you may spread this out to every 6 hours. If your symptoms persist tomorrow, continue with the same treatment, otherwise you may start spreading out to every 6-8 hours as needed. Use the nebulizer solution in place of your handheld inhaler. Please complete the rest of your prednisone. Please stop your Symbicort and switch to Trelegy Ellipta for daily maintenance of your asthma. Please also follow-up with your primary care physician should your asthma symptoms persist. If you continue to feel tight after completion of the prednisone, I have called in azithromycin for you to use.

## 2023-01-07 NOTE — ED Triage Notes (Signed)
Seen on 9/20 for bronchitis, reports is not coughing any phlegm up, feels really tight and short of breath.

## 2023-01-07 NOTE — ED Provider Notes (Signed)
[facility]    CSN: [id number] Arrival date & time: 01/07/23  1247      History   Chief Complaint Chief Complaint  Patient presents with   Cough    HPI Virginia May is a 37 y.o. female.   Pleasant 37 year old female with a known history of moderate persistent asthma presents today due to concerns of continued cough.  She was initially seen at [facility] around the 17th of September, at that point she states she had had symptoms for nearly 1 week.  She was given a nebulizer treatment at the urgent care, with a DuoNeb, no additional treatments were offered at that time.  Patient was then seen 3 days ago and offered oral prednisone.  She states she usually does well with this treatment, however notices very little improvement with oral prednisone alone.  States she feels tight and wheezy at rest.  Denies fever or chest pain.  She denies URI symptoms.  She continues to take her Symbicort, montelukast, Flonase, Zyrtec daily.  She has been using her nebulizer twice daily.   Cough   Past Medical History:  Diagnosis Date   Asthma, moderate persistent 10/28/2014   Diabetes (HCC)    Headache    Obesity 10/28/2014   PONV (postoperative nausea and vomiting)    Tibia/fibula fracture r    Patient Active Problem List   Diagnosis Date Noted   Insulin resistance 06/13/2022   Health care maintenance 06/11/2022   SOB (shortness of breath) on exertion 06/11/2022   Morbid obesity (HCC) 05/17/2022   BMI 50.0-59.9, adult (HCC) 05/17/2022   Elevated blood pressure reading 05/17/2022   Diabetes mellitus type 2 in obese 05/17/2022   Fatigue 05/17/2022   Visceral obesity 05/17/2022   Abnormal uterine bleeding 06/20/2021   Hypothyroid 06/07/2021   Iron deficiency 06/07/2021   Primary insomnia 06/07/2021   Annual physical exam 06/07/2021   Seasonal allergic rhinitis 07/21/2018   Primary osteoarthritis of right foot 10/16/2017   Obesity 10/28/2014   Asthma, moderate  persistent 10/28/2014   Vitamin D deficiency 06/26/2012    Past Surgical History:  Procedure Laterality Date   ARTHRODESIS METATARSAL Right 04/17/2018   Procedure: 1ST AND 2ND TARSOMETATARSAL ARTHRODESIS -W- POSSIBLE 3RD TARSOMETATARSAL FUSION AND ANKLE ARTHROSCOPY DEBRIDEMENT -W- POSSIBLE LATERAL LIGAMENT RECONSTRUCTION AND HARDWARE REMOVAL;  Surgeon: Terance Hart, MD;  Location: [facility] OR;  Service: Orthopedics;  Laterality: Right;  LENGTH: 210 HRS   IM NAILING TIBIA Right    RECONSTRUCTION OF ANGULAR DEFORMITY,TOE Left 01/23/2022   Procedure: ANGULAR CORRECTION OF SECOND TOE;  Surgeon: Terance Hart, MD;  Location: [facility] OR;  Service: Orthopedics;  Laterality: Left;   REPAIR EXTENSOR TENDON WITH METATARSAL OSTEOTOMY AND OPEN REDUCTION IN Left 01/23/2022   Procedure: LEFT SECOND METATARSOPHALANGEAL JOINT CAPSULOTOMY, SECOND METATARSAL OSTEOTOMY, PLANTAR PLATE RECONSTRUCTION;  Surgeon: Terance Hart, MD;  Location: [facility] OR;  Service: Orthopedics;  Laterality: Left;  LENGTH OF SURGERY: 90 MINUTES    OB History     Gravida  0   Para  0   Term  0   Preterm  0   AB  0   Living  0      SAB  0   IAB  0   Ectopic  0   Multiple  0   Live Births  0            Home Medications    Prior to Admission medications   Medication Sig Start  Date End Date Taking? Authorizing Provider  albuterol (PROVENTIL) (2.5 MG/3ML) 0.083% nebulizer solution Take 3 mLs (2.5 mg total) by nebulization every 4 (four) hours as needed for wheezing or shortness of breath. 01/07/23  Yes Obrien Huskins L, PA  azithromycin (ZITHROMAX) 250 MG tablet Take 1 tablet (250 mg total) by mouth daily. Take first 2 tablets together, then 1 every day until finished. 01/07/23  Yes Hong Moring L, PA  Fluticasone-Umeclidin-Vilant (TRELEGY ELLIPTA) 100-62.5-25 MCG/ACT AEPB Inhale 1 Inhalation into the lungs daily. 01/07/23  Yes Demetria Lightsey L, PA  albuterol (VENTOLIN HFA) 108 (90 Base) MCG/ACT inhaler  INHALE 2 PUFFS INTO THE LUNGS EVERY 6 HOURS AS NEEDED FOR WHEEZE 12/27/22   Monica Becton, MD  cetirizine (ZYRTEC) 10 MG tablet Take 1 tablet (10 mg total) by mouth daily. 06/16/20   Monica Becton, MD  Dulaglutide (TRULICITY) 0.75 MG/0.5ML SOPN Inject 0.75 mg into the skin once a week. 08/09/22   Suzan Slick, MD  fluticasone (FLONASE) 50 MCG/ACT nasal spray Place 1 spray into both nostrils daily. 05/16/22   Suzan Slick, MD  levonorgestrel (MIRENA, 52 MG,) 20 MCG/DAY IUD 1 each by Intrauterine route once. 01/16/22   [provider]  metFORMIN (GLUCOPHAGE) 500 MG tablet Take 1 tablet (500 mg total) by mouth daily with lunch. 08/09/22   Suzan Slick, MD  montelukast (SINGULAIR) 10 MG tablet Take 1 tablet (10 mg total) by mouth at bedtime. 08/09/22   Suzan Slick, MD  predniSONE (DELTASONE) 50 MG tablet One tablet a day for 5 days 01/04/23   Elson Areas, PA-C  Vitamin D, Ergocalciferol, (DRISDOL) 1.25 MG (50000 UNIT) CAPS capsule Take 1 capsule (50,000 Units total) by mouth every 7 (seven) days. 05/17/22   Suzan Slick, MD    Family History Family History  Problem Relation Age of Onset   Diabetes Mother    Hypertension Mother    Diabetes Father    Hypertension Father    High Cholesterol Father    Stroke Maternal Grandmother    Hypertension Maternal Grandmother    Stroke Maternal Grandfather    Cancer Maternal Uncle     Social History Social History   Tobacco Use   Smoking status: Never   Smokeless tobacco: Never  Vaping Use   Vaping status: Never Used  Substance Use Topics   Alcohol use: No   Drug use: No     Allergies   Ibuprofen   Review of Systems Review of Systems  Respiratory:  Positive for cough.   As per HPI   Physical Exam Triage Vital Signs ED Triage Vitals  Encounter Vitals Group     BP 01/07/23 1253 120/80     Systolic BP Percentile --      Diastolic BP Percentile --      Pulse Rate 01/07/23 1253 90      Resp 01/07/23 1253 20     Temp 01/07/23 1253 97.9 F (36.6 C)     Temp Source 01/07/23 1253 Oral     SpO2 01/07/23 1253 97 %     Weight --      Height --      Head Circumference --      Peak Flow --      Pain Score 01/07/23 1255 0     Pain Loc --      Pain Education --      Exclude from Growth Chart --    No data found.  Updated  Vital Signs BP 120/80 (BP Location: Left Arm)   Pulse 90   Temp 97.9 F (36.6 C) (Oral)   Resp 20   SpO2 97%   Visual Acuity Right Eye Distance:   Left Eye Distance:   Bilateral Distance:    Right Eye Near:   Left Eye Near:    Bilateral Near:     Physical Exam Vitals and nursing note reviewed.  Constitutional:      General: She is not in acute distress.    Appearance: Normal appearance. She is obese. She is not ill-appearing, toxic-appearing or diaphoretic.  HENT:     Head: Normocephalic and atraumatic.     Right Ear: Tympanic membrane, ear canal and external ear normal. There is no impacted cerumen.     Left Ear: Tympanic membrane, ear canal and external ear normal. There is no impacted cerumen.     Nose: Nose normal. No congestion or rhinorrhea.     Mouth/Throat:     Mouth: Mucous membranes are moist.     Pharynx: Oropharynx is clear. No oropharyngeal exudate or posterior oropharyngeal erythema.  Eyes:     General: No scleral icterus.       Right eye: No discharge.        Left eye: No discharge.     Extraocular Movements: Extraocular movements intact.     Pupils: Pupils are equal, round, and reactive to light.  Cardiovascular:     Rate and Rhythm: Normal rate and regular rhythm.  Pulmonary:     Effort: Pulmonary effort is normal. No respiratory distress.     Breath sounds: No stridor. Wheezing (generalized wheezing all posterior lung fields) present. No rhonchi or rales.  Musculoskeletal:     Cervical back: Normal range of motion and neck supple. No rigidity or tenderness.  Skin:    General: Skin is warm and dry.     Coloration:  Skin is not jaundiced.     Findings: No bruising, erythema or rash.  Neurological:     General: No focal deficit present.     Mental Status: She is alert and oriented to person, place, and time.      UC Treatments / Results  Labs (all labs ordered are listed, but only abnormal results are displayed) Labs Reviewed - No data to display  EKG   Radiology No results found.  Procedures Procedures (including critical care time)  Medications Ordered in UC Medications  albuterol (PROVENTIL) (2.5 MG/3ML) 0.083% nebulizer solution 2.5 mg (2.5 mg Nebulization Given 01/07/23 1325)  methylPREDNISolone sodium succinate (SOLU-MEDROL) 125 mg/2 mL injection 80 mg (80 mg Intramuscular Given 01/07/23 1413)    Initial Impression / Assessment and Plan / UC Course  I have reviewed the triage vital signs and the nursing notes.  Pertinent labs & imaging results that were available during my care of the patient were reviewed by me and considered in my medical decision making (see chart for details).     Moderate persistent asthma with exacerbation -patient had significant improvement status post the nebulizer with plain albuterol.  I will therefore prescribe albuterol for patient to use in her nebulizer machine at home to replace the DuoNeb solution.  Recommended she do this every 4-6 hours for the first 24 hours, then start spacing out as indicated based on her response.  She is to continue her p.o. prednisone, we did give a single injection of 80 mg Solu-Medrol in office today as well.  I will have her stop her Symbicort and  switch to Trelegy Ellipta as her symptoms sound rather persistent and uncontrolled.  I did give patient a prescription for azithromycin in the off chance that there may be a bacterial process occurring, but recommended she withhold starting this medication for the next 2 to 3 days to monitor her response to the treatment provided today.  Patient reports understanding and agreement with  treatment plan.   Final Clinical Impressions(s) / UC Diagnoses   Final diagnoses:  Moderate persistent asthma with exacerbation     Discharge Instructions      To help with your asthma exacerbation, please use the albuterol nebulizer solution every 4 hours around-the-clock for the next 24 hours.  If you develop a rapid heartbeat, or tremor, you may spread this out to every 6 hours. If your symptoms persist tomorrow, continue with the same treatment, otherwise you may start spreading out to every 6-8 hours as needed. Use the nebulizer solution in place of your handheld inhaler. Please complete the rest of your prednisone. Please stop your Symbicort and switch to Trelegy Ellipta for daily maintenance of your asthma. Please also follow-up with your primary care physician should your asthma symptoms persist. If you continue to feel tight after completion of the prednisone, I have called in azithromycin for you to use.     ED Prescriptions     Medication Sig Dispense Auth. Provider   Fluticasone-Umeclidin-Vilant (TRELEGY ELLIPTA) 100-62.5-25 MCG/ACT AEPB Inhale 1 Inhalation into the lungs daily. 60 each Annamary Buschman L, PA   albuterol (PROVENTIL) (2.5 MG/3ML) 0.083% nebulizer solution Take 3 mLs (2.5 mg total) by nebulization every 4 (four) hours as needed for wheezing or shortness of breath. 75 mL Van Seymore L, PA   azithromycin (ZITHROMAX) 250 MG tablet Take 1 tablet (250 mg total) by mouth daily. Take first 2 tablets together, then 1 every day until finished. 6 tablet Chrishonda Hesch L, [place]      PDMP not reviewed this encounter.   Maretta Bees, [place] 01/08/23 2035

## 2023-01-09 ENCOUNTER — Telehealth: Payer: Self-pay

## 2023-01-09 NOTE — Telephone Encounter (Signed)
Received fax about possible duplicate therapy. Provider notified.

## 2023-12-17 ENCOUNTER — Encounter: Payer: Self-pay | Admitting: Sports Medicine
